# Patient Record
Sex: Male | Born: 1950
Health system: Southern US, Community
[De-identification: ages and names within clinical notes are randomized; demographics above are authoritative.]

## PROBLEM LIST (undated history)

## (undated) ENCOUNTER — Emergency Department: Admission: RE | Payer: Medicare Other | Source: Home / Self Care | Admitting: Pain Medicine

## (undated) DIAGNOSIS — M545 Low back pain, unspecified: Secondary | ICD-10-CM

## (undated) DIAGNOSIS — F4312 Post-traumatic stress disorder, chronic: Secondary | ICD-10-CM

## (undated) DIAGNOSIS — I739 Peripheral vascular disease, unspecified: Secondary | ICD-10-CM

## (undated) DIAGNOSIS — M1990 Unspecified osteoarthritis, unspecified site: Secondary | ICD-10-CM

## (undated) DIAGNOSIS — F319 Bipolar disorder, unspecified: Secondary | ICD-10-CM

## (undated) DIAGNOSIS — F419 Anxiety disorder, unspecified: Secondary | ICD-10-CM

## (undated) DIAGNOSIS — F4310 Post-traumatic stress disorder, unspecified: Secondary | ICD-10-CM

## (undated) DIAGNOSIS — F515 Nightmare disorder: Secondary | ICD-10-CM

## (undated) DIAGNOSIS — F329 Major depressive disorder, single episode, unspecified: Secondary | ICD-10-CM

## (undated) DIAGNOSIS — K4090 Unilateral inguinal hernia, without obstruction or gangrene, not specified as recurrent: Secondary | ICD-10-CM

## (undated) DIAGNOSIS — F32A Depression, unspecified: Secondary | ICD-10-CM

## (undated) HISTORY — PX: BACK SURGERY: SHX140

## (undated) HISTORY — DX: Unspecified osteoarthritis, unspecified site: M19.90

## (undated) HISTORY — DX: Nightmare disorder: F51.5

## (undated) HISTORY — DX: Anxiety disorder, unspecified: F41.9

## (undated) HISTORY — DX: Post-traumatic stress disorder, unspecified: F43.10

## (undated) HISTORY — DX: Low back pain, unspecified: M54.50

## (undated) HISTORY — DX: Major depressive disorder, single episode, unspecified: F32.9

## (undated) HISTORY — PX: CERVICAL SPINE SURGERY: SHX589

## (undated) HISTORY — DX: Depression, unspecified: F32.A

## (undated) HISTORY — DX: Peripheral vascular disease, unspecified: I73.9

## (undated) HISTORY — DX: Post-traumatic stress disorder, chronic: F43.12

## (undated) HISTORY — DX: Bipolar disorder, unspecified: F31.9

## (undated) HISTORY — DX: Unilateral inguinal hernia, without obstruction or gangrene, not specified as recurrent: K40.90

## (undated) HISTORY — DX: Low back pain: M54.5

## (undated) HISTORY — PX: NOSE SURGERY: SHX723

---

## 2013-10-12 ENCOUNTER — Emergency Department: Payer: Self-pay | Admitting: Emergency Medicine

## 2014-07-30 ENCOUNTER — Emergency Department: Payer: Medicare PPO

## 2014-07-30 ENCOUNTER — Emergency Department
Admission: EM | Admit: 2014-07-30 | Discharge: 2014-07-30 | Disposition: A | Discharge status: Other Acute Inpatient Hospital | Payer: Medicare PPO | Attending: Emergency Medicine | Admitting: Emergency Medicine

## 2014-07-30 ENCOUNTER — Encounter: Payer: Self-pay | Admitting: Emergency Medicine

## 2014-07-30 DIAGNOSIS — S129XXA Fracture of neck, unspecified, initial encounter: Secondary | ICD-10-CM | POA: Insufficient documentation

## 2014-07-30 DIAGNOSIS — S069X1A Unspecified intracranial injury with loss of consciousness of 30 minutes or less, initial encounter: Secondary | ICD-10-CM | POA: Insufficient documentation

## 2014-07-30 DIAGNOSIS — Y998 Other external cause status: Secondary | ICD-10-CM | POA: Diagnosis not present

## 2014-07-30 DIAGNOSIS — Z720 Tobacco use: Secondary | ICD-10-CM | POA: Diagnosis not present

## 2014-07-30 DIAGNOSIS — S5292XA Unspecified fracture of left forearm, initial encounter for closed fracture: Secondary | ICD-10-CM

## 2014-07-30 DIAGNOSIS — S52592A Other fractures of lower end of left radius, initial encounter for closed fracture: Secondary | ICD-10-CM | POA: Diagnosis not present

## 2014-07-30 DIAGNOSIS — S22059A Unspecified fracture of T5-T6 vertebra, initial encounter for closed fracture: Secondary | ICD-10-CM | POA: Insufficient documentation

## 2014-07-30 DIAGNOSIS — Y9289 Other specified places as the place of occurrence of the external cause: Secondary | ICD-10-CM | POA: Insufficient documentation

## 2014-07-30 DIAGNOSIS — Y9389 Activity, other specified: Secondary | ICD-10-CM | POA: Diagnosis not present

## 2014-07-30 DIAGNOSIS — S199XXA Unspecified injury of neck, initial encounter: Secondary | ICD-10-CM | POA: Diagnosis present

## 2014-07-30 DIAGNOSIS — M542 Cervicalgia: Secondary | ICD-10-CM

## 2014-07-30 MED ORDER — MORPHINE SULFATE 4 MG/ML IJ SOLN
4.0000 mg | Freq: Once | INTRAMUSCULAR | Status: AC
  Administered 2014-07-30: 4 mg via INTRAVENOUS

## 2014-07-30 MED ORDER — ONDANSETRON HCL 4 MG/2ML IJ SOLN
INTRAMUSCULAR | Status: AC
  Administered 2014-07-30: 4 mg via INTRAVENOUS
  Filled 2014-07-30: qty 2

## 2014-07-30 MED ORDER — ONDANSETRON HCL 4 MG/2ML IJ SOLN
4.0000 mg | Freq: Once | INTRAMUSCULAR | Status: AC
  Administered 2014-07-30: 4 mg via INTRAVENOUS

## 2014-07-30 MED ORDER — MORPHINE SULFATE 4 MG/ML IJ SOLN
INTRAMUSCULAR | Status: AC
  Administered 2014-07-30: 4 mg via INTRAVENOUS
  Filled 2014-07-30: qty 1

## 2014-07-30 MED ORDER — MORPHINE SULFATE 4 MG/ML IJ SOLN
INTRAMUSCULAR | Status: AC
  Filled 2014-07-30: qty 1
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
## 2014-07-30 NOTE — ED Notes (Signed)
c- collar placed on pt, charge nurse and MD aware of pt's ct result, pt moved to subwait area until open bed available
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
## 2014-07-31 DIAGNOSIS — S22019A Unspecified fracture of first thoracic vertebra, initial encounter for closed fracture: Secondary | ICD-10-CM | POA: Insufficient documentation

## 2014-07-31 DIAGNOSIS — S22030A Wedge compression fracture of third thoracic vertebra, initial encounter for closed fracture: Secondary | ICD-10-CM | POA: Insufficient documentation

## 2014-08-04 ENCOUNTER — Ambulatory Visit: Payer: Medicare PPO | Admitting: Psychiatry

## 2014-08-15 ENCOUNTER — Encounter: Payer: Self-pay | Admitting: Emergency Medicine

## 2014-08-15 ENCOUNTER — Emergency Department: Payer: Medicare PPO

## 2014-08-15 ENCOUNTER — Emergency Department
Admission: EM | Admit: 2014-08-15 | Discharge: 2014-08-15 | Disposition: A | Discharge status: Home / Self Care | Payer: Medicare PPO | Attending: Emergency Medicine | Admitting: Emergency Medicine

## 2014-08-15 DIAGNOSIS — Z9889 Other specified postprocedural states: Secondary | ICD-10-CM | POA: Diagnosis not present

## 2014-08-15 DIAGNOSIS — M25512 Pain in left shoulder: Secondary | ICD-10-CM | POA: Diagnosis present

## 2014-08-15 DIAGNOSIS — Z720 Tobacco use: Secondary | ICD-10-CM | POA: Insufficient documentation

## 2014-08-15 MED ORDER — PREDNISONE 10 MG PO TABS: ORAL_TABLET | ORAL | Status: DC

## 2014-08-15 NOTE — ED Notes (Signed)
Patient presents to the ED with painful left shoulder.  Patient had emergency neck and shoulder surgery at [facility] on June 3rd.  Patient states yesterday left shoulder started hurting unusually severely.  Patient denies any reinjury.

## 2014-08-15 NOTE — Discharge Instructions (Signed)
° ° °  CONTINUE TO WEAR SHOULDER IMMOBILIZER FOR SUPPORT UNTIL YOU SEE YOUR DOCTOR AT [facility] TOMORROW. CONTINUE YOUR CURRENT MEDICATIONS AND BEGIN PREDNISONE 3 TABLETS TODAY AND 3 TABLETS THE NEXT 2 DAYS KEEP YOUR APPOINTMENT
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
## 2014-08-15 NOTE — ED Notes (Signed)
Had recent neck surgery at [facility] for fx, yesterday with severe left shoulder pain and neck, no injury , pt recalls having his neck brace to re adjust when pain started

## 2014-08-15 NOTE — ED Notes (Signed)
Patient returned from X-ray 

## 2014-08-31 ENCOUNTER — Ambulatory Visit: Payer: Self-pay | Admitting: Family Medicine

## 2014-09-03 ENCOUNTER — Ambulatory Visit: Payer: Self-pay | Admitting: Family Medicine

## 2015-01-06 ENCOUNTER — Emergency Department: Payer: Medicare PPO

## 2015-01-06 ENCOUNTER — Emergency Department
Admission: EM | Admit: 2015-01-06 | Discharge: 2015-01-06 | Disposition: A | Discharge status: Home / Self Care | Payer: Medicare PPO | Attending: Student | Admitting: Student

## 2015-01-06 DIAGNOSIS — S52502A Unspecified fracture of the lower end of left radius, initial encounter for closed fracture: Secondary | ICD-10-CM

## 2015-01-06 DIAGNOSIS — Y9389 Activity, other specified: Secondary | ICD-10-CM | POA: Insufficient documentation

## 2015-01-06 DIAGNOSIS — Y9289 Other specified places as the place of occurrence of the external cause: Secondary | ICD-10-CM | POA: Insufficient documentation

## 2015-01-06 DIAGNOSIS — S134XXA Sprain of ligaments of cervical spine, initial encounter: Secondary | ICD-10-CM | POA: Insufficient documentation

## 2015-01-06 DIAGNOSIS — Y998 Other external cause status: Secondary | ICD-10-CM | POA: Insufficient documentation

## 2015-01-06 DIAGNOSIS — S6992XA Unspecified injury of left wrist, hand and finger(s), initial encounter: Secondary | ICD-10-CM | POA: Diagnosis present

## 2015-01-06 DIAGNOSIS — W1839XA Other fall on same level, initial encounter: Secondary | ICD-10-CM | POA: Diagnosis not present

## 2015-01-06 DIAGNOSIS — S20229A Contusion of unspecified back wall of thorax, initial encounter: Secondary | ICD-10-CM | POA: Diagnosis not present

## 2015-01-06 DIAGNOSIS — S52592A Other fractures of lower end of left radius, initial encounter for closed fracture: Secondary | ICD-10-CM | POA: Insufficient documentation

## 2015-01-06 DIAGNOSIS — Z720 Tobacco use: Secondary | ICD-10-CM | POA: Diagnosis not present

## 2015-01-06 DIAGNOSIS — Z7952 Long term (current) use of systemic steroids: Secondary | ICD-10-CM | POA: Insufficient documentation

## 2015-01-06 MED ORDER — HYDROCODONE-ACETAMINOPHEN 5-325 MG PO TABS: 1.0000 | ORAL_TABLET | ORAL | Status: DC | PRN

## 2015-01-06 MED ORDER — HYDROCODONE-ACETAMINOPHEN 5-325 MG PO TABS
1.0000 | ORAL_TABLET | Freq: Once | ORAL | Status: AC
  Administered 2015-01-06: 1 via ORAL
  Filled 2015-01-06: qty 1

## 2015-01-06 NOTE — ED Provider Notes (Signed)
[facility] Emergency Department Provider Note  ____________________________________________  Time seen: Approximately 12:22 PM  I have reviewed the triage vital signs and the nursing notes.   HISTORY  Chief Complaint Back Pain; Neck Pain; and Wrist Pain   HPI Hector George is a 64 y.o. male is here with complaint of neck pain, back pain, and wrist pain. Patient states he fell 2 weeks ago while he was putting on his pants. He states he did not fall backwards, he did not hit his head and there was no loss of consciousness. He has continued to have rest pain. He states that he fell in June of this year and had surgery which was done by the neurosurgeon. He says he does not have any medication for pain at home. Family member with him states that he did have a wrist fracture back in June but refused to see an orthopedist about it. He is uncertain whether this is a new fracture to his distal radius now or whether it is old. Family member states that he did wear a OCL splint and took it off himself. He rates his pain as an 8 out of 10. He states he is unable to rest because of the pain.   History reviewed. No pertinent past medical history.  There are no active problems to display for this patient.   Past Surgical History  Procedure Laterality Date  . Cervical spine surgery    . Back surgery      Current Outpatient Rx  Name  Route  Sig  Dispense  Refill  . HYDROcodone-acetaminophen (NORCO/VICODIN) 5-325 MG tablet   Oral   Take 1 tablet by mouth every 4 (four) hours as needed for moderate pain.   20 tablet   0   . predniSONE (DELTASONE) 10 MG tablet      Take 3 tablets once a day for 3 days   9 tablet   0     Allergies Review of patient's allergies indicates no known allergies.  No family history on file.  Social History Social History  Substance Use Topics  . Smoking status: Current Every Day Smoker    Types: Cigarettes  . Smokeless tobacco: None   . Alcohol Use: Yes    Review of Systems Constitutional: No fever/chills Eyes: No visual changes. ENT: No sore throat. Cardiovascular: Denies chest pain. Respiratory: Denies shortness of breath. Gastrointestinal: No abdominal pain.  No nausea, no vomiting.  Musculoskeletal: Positive for back pain, positive left wrist pain. Skin: Negative for rash. Neurological: Negative for headaches, focal weakness or numbness.  10-point ROS otherwise negative.  ____________________________________________   PHYSICAL EXAM:  VITAL SIGNS: ED Triage Vitals  Enc Vitals Group     BP 01/06/15 1157 116/77 mmHg     Pulse Rate 01/06/15 1157 97     Resp 01/06/15 1157 20     Temp 01/06/15 1157 97.8 F (36.6 C)     Temp Source 01/06/15 1157 Oral     SpO2 01/06/15 1157 99 %     Weight 01/06/15 1157 127 lb (57.607 kg)     Height 01/06/15 1157  (1.727 m)     Head Cir --      Peak Flow --      Pain Score 01/06/15 1157 8     Pain Loc --      Pain Edu? --      Excl. in GC? --     Constitutional: Alert and oriented. Well appearing and  in no acute distress. Eyes: Conjunctivae are normal. PERRL. EOMI. Head: Atraumatic. Nose: No congestion/rhinnorhea. Neck: No stridor.  There is some tenderness on palpation of cervical spine. There is a well-healed surgical scar. Vertebral changes posteriorly are secondary to surgery. Range of motion is minimally restricted secondary to discomfort. Cardiovascular: Normal rate, regular rhythm. Grossly normal heart sounds.  Good peripheral circulation. Respiratory: Normal respiratory effort.  No retractions. Lungs CTAB. Gastrointestinal: Soft and nontender. No distention. Musculoskeletal: Moves extremities with no difficulty. Thoracic spine is somewhat tender on palpation around T1-T2 and T3. No gross deformity was noted. No lower extremity tenderness nor edema. Left wrist there is moderate tenderness along with some soft tissue swelling present. Range of motion is  minimally restricted in flexion and extension secondary to pain. Neurologic:  Normal speech and language. No gross focal neurologic deficits are appreciated. Patient was seen multiple times walking through the emergency room at a fast rate without any difficulty at all. Skin:  Skin is warm, dry and intact. No rash noted. Psychiatric: Mood and affect are normal. Speech and behavior are normal.  ____________________________________________   LABS (all labs ordered are listed, but only abnormal results are displayed)  Labs Reviewed - No data to display  RADIOLOGY Left wrist shows acute on chronic distal radial fracture. Extensive soft tissue swelling is present. Per radiologist.  Cervical spine x-ray shows prior fracture of T1 vertebral body. There is moderate disc space narrowing at C4-C5 and C6-C7. There is postoperative screw present along with plate fixation.    Thoracic spine x-ray per radiologist shows prior fracture T1 vertebral body with fixation posteriorly. There is a mild anterior wedge T9 vertebral body.  ____________________________________________   PROCEDURES  Procedure(s) performed: None  Critical Care performed: No  ____________________________________________   INITIAL IMPRESSION / ASSESSMENT AND PLAN / ED COURSE  Pertinent labs & imaging results that were available during my care of the patient were reviewed by me and considered in my medical decision making (see chart for details).  Patient was not tender around T9 area. He was given information about his other x-rays and was placed in a cockup wrist splint for his left wrist fracture. He was encouraged to call and make an appointment with the orthopedist this time to follow-up with his wrist rather than just where the splint taken off.   he was given prescription for Norco as needed for pain. Deltasone 10 mg 3 tablets once a day for 3 days. Patient was told this would help also with inflammation from his  arthritis and prior surgery. ____________________________________________   FINAL CLINICAL IMPRESSION(S) / ED DIAGNOSES  Final diagnoses:  Fracture of distal end of radius, left, closed, initial encounter  Contusion of upper back, unspecified laterality, initial encounter  Sprain of ligaments of cervical spine, initial encounter      Tommi RumpsRhonda L Alajah Witman, PA-C 01/06/15 1522  Gayla DossEryka A Gayle, MD 01/06/15 1626

## 2015-01-06 NOTE — ED Notes (Signed)
Pt reports falling X 2 weeks ago, pain in back, neck and left wrist since. Pt hx of back surgery 07/27/04. Ambulatory. Pt alert and oriented X4, active, cooperative, pt in NAD. RR even and unlabored, color WNL.

## 2015-01-06 NOTE — Discharge Instructions (Signed)
ICE AND ELEVATE FOR SWELLING.  NORCO FOR PAIN AS NEEDED CALL AND MAKE AN APPOINTMENT AT ORTHOPEDIST (DR. MENZ) FOR FOLLOW UP WITH YOUR WRIST. SEE YOUR NEUROSURGEON IF ANY CONTINUED NECK PROBLEMS

## 2015-01-28 ENCOUNTER — Encounter: Payer: Self-pay | Admitting: Medical Oncology

## 2015-01-28 ENCOUNTER — Emergency Department
Admission: EM | Admit: 2015-01-28 | Discharge: 2015-01-28 | Disposition: A | Discharge status: Home / Self Care | Payer: Medicare PPO | Attending: Emergency Medicine | Admitting: Emergency Medicine

## 2015-01-28 DIAGNOSIS — Y92007 Garden or yard of unspecified non-institutional (private) residence as the place of occurrence of the external cause: Secondary | ICD-10-CM | POA: Diagnosis not present

## 2015-01-28 DIAGNOSIS — Y998 Other external cause status: Secondary | ICD-10-CM | POA: Insufficient documentation

## 2015-01-28 DIAGNOSIS — S199XXA Unspecified injury of neck, initial encounter: Secondary | ICD-10-CM | POA: Diagnosis not present

## 2015-01-28 DIAGNOSIS — S29011A Strain of muscle and tendon of front wall of thorax, initial encounter: Secondary | ICD-10-CM | POA: Diagnosis not present

## 2015-01-28 DIAGNOSIS — F17210 Nicotine dependence, cigarettes, uncomplicated: Secondary | ICD-10-CM | POA: Diagnosis not present

## 2015-01-28 DIAGNOSIS — S29012A Strain of muscle and tendon of back wall of thorax, initial encounter: Secondary | ICD-10-CM

## 2015-01-28 DIAGNOSIS — Z7952 Long term (current) use of systemic steroids: Secondary | ICD-10-CM | POA: Diagnosis not present

## 2015-01-28 DIAGNOSIS — Y9389 Activity, other specified: Secondary | ICD-10-CM | POA: Insufficient documentation

## 2015-01-28 DIAGNOSIS — W1839XA Other fall on same level, initial encounter: Secondary | ICD-10-CM | POA: Diagnosis not present

## 2015-01-28 DIAGNOSIS — S299XXA Unspecified injury of thorax, initial encounter: Secondary | ICD-10-CM | POA: Diagnosis present

## 2015-01-28 MED ORDER — MELOXICAM 15 MG PO TABS: 15.0000 mg | ORAL_TABLET | Freq: Every day | ORAL | Status: DC

## 2015-01-28 MED ORDER — HYDROCODONE-ACETAMINOPHEN 5-325 MG PO TABS: 1.0000 | ORAL_TABLET | ORAL | Status: DC | PRN

## 2015-01-28 NOTE — ED Notes (Signed)
Chronic neck back pain

## 2015-01-28 NOTE — ED Notes (Signed)
Pt reports that he has had back surgery in June, states that he began having worsening back pain and pain to left wrist over 2 days. Denies new injury.
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
## 2015-01-28 NOTE — Discharge Instructions (Signed)

## 2015-02-09 ENCOUNTER — Emergency Department
Admission: EM | Admit: 2015-02-09 | Discharge: 2015-02-09 | Disposition: A | Discharge status: Home / Self Care | Payer: Medicare PPO | Attending: Student | Admitting: Student

## 2015-02-09 ENCOUNTER — Encounter: Payer: Self-pay | Admitting: Emergency Medicine

## 2015-02-09 DIAGNOSIS — G8929 Other chronic pain: Secondary | ICD-10-CM | POA: Insufficient documentation

## 2015-02-09 DIAGNOSIS — F17210 Nicotine dependence, cigarettes, uncomplicated: Secondary | ICD-10-CM | POA: Diagnosis not present

## 2015-02-09 DIAGNOSIS — M546 Pain in thoracic spine: Secondary | ICD-10-CM | POA: Insufficient documentation

## 2015-02-09 DIAGNOSIS — Z791 Long term (current) use of non-steroidal anti-inflammatories (NSAID): Secondary | ICD-10-CM | POA: Insufficient documentation

## 2015-02-09 DIAGNOSIS — M549 Dorsalgia, unspecified: Secondary | ICD-10-CM

## 2015-02-09 MED ORDER — HYDROCODONE-ACETAMINOPHEN 5-325 MG PO TABS: 1.0000 | ORAL_TABLET | Freq: Four times a day (QID) | ORAL | Status: DC | PRN
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
## 2015-02-09 NOTE — Discharge Instructions (Signed)
[facility] Norco as needed for pain only as directed.    Discontinue taking tramadol.
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
## 2015-03-08 ENCOUNTER — Encounter (HOSPITAL_COMMUNITY): Payer: Self-pay | Admitting: Emergency Medicine

## 2015-03-08 ENCOUNTER — Emergency Department (HOSPITAL_COMMUNITY): Payer: Commercial Managed Care - HMO

## 2015-03-08 ENCOUNTER — Emergency Department (HOSPITAL_COMMUNITY)
Admission: EM | Admit: 2015-03-08 | Discharge: 2015-03-08 | Disposition: A | Discharge status: Home / Self Care | Payer: Commercial Managed Care - HMO | Attending: Emergency Medicine | Admitting: Emergency Medicine

## 2015-03-08 DIAGNOSIS — Z79899 Other long term (current) drug therapy: Secondary | ICD-10-CM | POA: Insufficient documentation

## 2015-03-08 DIAGNOSIS — R42 Dizziness and giddiness: Secondary | ICD-10-CM | POA: Diagnosis not present

## 2015-03-08 DIAGNOSIS — F17210 Nicotine dependence, cigarettes, uncomplicated: Secondary | ICD-10-CM | POA: Insufficient documentation

## 2015-03-08 DIAGNOSIS — Z791 Long term (current) use of non-steroidal anti-inflammatories (NSAID): Secondary | ICD-10-CM | POA: Insufficient documentation

## 2015-03-08 DIAGNOSIS — M542 Cervicalgia: Secondary | ICD-10-CM

## 2015-03-08 DIAGNOSIS — Z7982 Long term (current) use of aspirin: Secondary | ICD-10-CM | POA: Insufficient documentation

## 2015-03-08 DIAGNOSIS — M546 Pain in thoracic spine: Secondary | ICD-10-CM | POA: Diagnosis not present

## 2015-03-08 MED ORDER — HYDROCODONE-ACETAMINOPHEN 5-325 MG PO TABS
1.0000 | ORAL_TABLET | Freq: Once | ORAL | Status: AC
  Administered 2015-03-08: 1 via ORAL
  Filled 2015-03-08: qty 1

## 2015-03-08 MED ORDER — HYDROCODONE-ACETAMINOPHEN 5-325 MG PO TABS: ORAL_TABLET | ORAL | Status: DC

## 2015-03-08 MED ORDER — METHOCARBAMOL 500 MG PO TABS: 500.0000 mg | ORAL_TABLET | Freq: Three times a day (TID) | ORAL | Status: DC

## 2015-03-08 NOTE — ED Notes (Signed)
Pt given OJ to drink.  

## 2015-03-08 NOTE — ED Notes (Signed)
Pt states that he had neck surgery in June with hardware placement and is having pain with no new injury.
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
## 2015-03-10 DIAGNOSIS — Z981 Arthrodesis status: Secondary | ICD-10-CM | POA: Diagnosis not present

## 2015-03-10 DIAGNOSIS — M47812 Spondylosis without myelopathy or radiculopathy, cervical region: Secondary | ICD-10-CM | POA: Diagnosis not present

## 2015-03-10 DIAGNOSIS — M40202 Unspecified kyphosis, cervical region: Secondary | ICD-10-CM | POA: Diagnosis not present

## 2015-03-23 DIAGNOSIS — I10 Essential (primary) hypertension: Secondary | ICD-10-CM | POA: Diagnosis not present

## 2015-03-23 DIAGNOSIS — N400 Enlarged prostate without lower urinary tract symptoms: Secondary | ICD-10-CM | POA: Diagnosis not present

## 2015-03-23 DIAGNOSIS — F17200 Nicotine dependence, unspecified, uncomplicated: Secondary | ICD-10-CM | POA: Diagnosis not present

## 2015-03-23 DIAGNOSIS — F329 Major depressive disorder, single episode, unspecified: Secondary | ICD-10-CM | POA: Diagnosis not present

## 2015-03-23 DIAGNOSIS — Z681 Body mass index (BMI) 19 or less, adult: Secondary | ICD-10-CM | POA: Diagnosis not present

## 2015-04-12 DIAGNOSIS — M542 Cervicalgia: Secondary | ICD-10-CM | POA: Diagnosis not present

## 2015-04-12 DIAGNOSIS — S22010D Wedge compression fracture of first thoracic vertebra, subsequent encounter for fracture with routine healing: Secondary | ICD-10-CM | POA: Diagnosis not present

## 2015-05-11 ENCOUNTER — Ambulatory Visit: Payer: Commercial Managed Care - HMO | Admitting: Anesthesiology

## 2015-08-17 DIAGNOSIS — M5440 Lumbago with sciatica, unspecified side: Secondary | ICD-10-CM | POA: Diagnosis not present

## 2015-08-17 DIAGNOSIS — I739 Peripheral vascular disease, unspecified: Secondary | ICD-10-CM | POA: Diagnosis not present

## 2015-08-17 DIAGNOSIS — J449 Chronic obstructive pulmonary disease, unspecified: Secondary | ICD-10-CM | POA: Diagnosis not present

## 2015-12-27 ENCOUNTER — Emergency Department
Admission: EM | Admit: 2015-12-27 | Discharge: 2015-12-27 | Disposition: A | Discharge status: Home / Self Care | Payer: Commercial Managed Care - HMO | Attending: Emergency Medicine | Admitting: Emergency Medicine

## 2015-12-27 ENCOUNTER — Emergency Department: Payer: Commercial Managed Care - HMO

## 2015-12-27 ENCOUNTER — Encounter: Payer: Self-pay | Admitting: Emergency Medicine

## 2015-12-27 DIAGNOSIS — Z7982 Long term (current) use of aspirin: Secondary | ICD-10-CM | POA: Insufficient documentation

## 2015-12-27 DIAGNOSIS — L723 Sebaceous cyst: Secondary | ICD-10-CM | POA: Insufficient documentation

## 2015-12-27 DIAGNOSIS — R05 Cough: Secondary | ICD-10-CM | POA: Diagnosis present

## 2015-12-27 DIAGNOSIS — Z79899 Other long term (current) drug therapy: Secondary | ICD-10-CM | POA: Diagnosis not present

## 2015-12-27 DIAGNOSIS — F419 Anxiety disorder, unspecified: Secondary | ICD-10-CM | POA: Diagnosis not present

## 2015-12-27 DIAGNOSIS — R079 Chest pain, unspecified: Secondary | ICD-10-CM | POA: Diagnosis not present

## 2015-12-27 DIAGNOSIS — J40 Bronchitis, not specified as acute or chronic: Secondary | ICD-10-CM | POA: Insufficient documentation

## 2015-12-27 DIAGNOSIS — F17210 Nicotine dependence, cigarettes, uncomplicated: Secondary | ICD-10-CM | POA: Insufficient documentation

## 2015-12-27 MED ORDER — SULFAMETHOXAZOLE-TRIMETHOPRIM 800-160 MG PO TABS: 1.0000 | ORAL_TABLET | Freq: Two times a day (BID) | ORAL | 0 refills | Status: DC

## 2015-12-27 MED ORDER — PSEUDOEPH-BROMPHEN-DM 30-2-10 MG/5ML PO SYRP: 5.0000 mL | ORAL_SOLUTION | Freq: Four times a day (QID) | ORAL | 0 refills | Status: DC | PRN

## 2015-12-27 NOTE — ED Triage Notes (Signed)
Presents with cough since sat  No fever  State cough is occasionally prod

## 2015-12-27 NOTE — ED Provider Notes (Signed)
[facility] Emergency Department Provider Note   ____________________________________________   First MD Initiated Contact with Patient 12/27/15 1130     (approximate)  I have reviewed the triage vital signs and the nursing notes.   HISTORY  Chief Complaint Cough and Abscess    HPI Hector George is a 65 y.o. male patient complaining of 3 days of productive cough. Patient denies any fever with this cough. Patient states this intermittent sinus congestion. Patient denies any nausea vomiting or diarrhea. Patient also concern about 2 nodular lesion posterior inferior neck. Patient states the lesions have been apparent for over a year but noticed increase in size in the past week. Patient had cervical fusion from C5-T2. Patient denies any pain associated with these lesions.No palliative measures for both complaints.   History reviewed. No pertinent past medical history.  There are no active problems to display for this patient.   Past Surgical History:  Procedure Laterality Date  . BACK SURGERY    . CERVICAL SPINE SURGERY      Prior to Admission medications   Medication Sig Start Date End Date Taking? Authorizing Provider  ARIPiprazole (ABILIFY) 20 MG tablet Take 10 mg by mouth daily.    Historical Provider, MD  aspirin 325 MG tablet Take 325 mg by mouth 3 (three) times daily as needed (pain).    Historical Provider, MD  Aspirin-Salicylamide-Caffeine (BC HEADACHE POWDER PO) Take 1 packet by mouth daily as needed (pain).    Historical Provider, MD  brompheniramine-pseudoephedrine-DM 30-2-10 MG/5ML syrup Take 5 mLs by mouth 4 (four) times daily as needed. 12/27/15   Joni Reiningonald K Smith, PA-C  FLUoxetine (PROZAC) 10 MG capsule Take 30 mg by mouth daily.    Historical Provider, MD  HYDROcodone-acetaminophen (NORCO/VICODIN) 5-325 MG tablet Take one-two tabs po q 4-6 hrs prn pain 03/08/15   Tammy Triplett, PA-C  meloxicam (MOBIC) 15 MG tablet Take 1 tablet (15 mg  total) by mouth daily. 01/28/15   Delorise RoyalsJonathan D Cuthriell, PA-C  methocarbamol (ROBAXIN) 500 MG tablet Take 1 tablet (500 mg total) by mouth 3 (three) times daily. 03/08/15   Tammy Triplett, PA-C  predniSONE (DELTASONE) 10 MG tablet Take 3 tablets once a day for 3 days 08/15/14   Tommi Rumpshonda L Summers, PA-C  sulfamethoxazole-trimethoprim (BACTRIM DS,SEPTRA DS) 800-160 MG tablet Take 1 tablet by mouth 2 (two) times daily. 12/27/15   Joni Reiningonald K Smith, PA-C    Allergies Review of patient's allergies indicates no known allergies.  No family history on file.  Social History Social History  Substance Use Topics  . Smoking status: Current Every Day Smoker    Types: Cigarettes  . Smokeless tobacco: Never Used  . Alcohol use No    Review of Systems Constitutional: No fever/chills Eyes: No visual changes. ENT: No sore throat. Cardiovascular: Denies chest pain. Respiratory: Denies shortness of breath. Productive cough and chest congestion. Gastrointestinal: No abdominal pain.  No nausea, no vomiting.  No diarrhea.  No constipation. Genitourinary: Negative for dysuria. Musculoskeletal: Negative for back pain. Skin: Negative for rash. 2 nodular lesions posterior neck Neurological: Negative for headaches, focal weakness or numbness. Psychiatric:Anxiety ____________________________________________   PHYSICAL EXAM:  VITAL SIGNS: ED Triage Vitals  Enc Vitals Group     BP 12/27/15 1001 130/77     Pulse Rate 12/27/15 1001 71     Resp 12/27/15 1001 20     Temp 12/27/15 1001 (!) 93.2 F (34 C)     Temp Source 12/27/15 1001 Oral  SpO2 12/27/15 1001 97 %     Weight 12/27/15 1002 130 lb (59 kg)     Height 12/27/15 1002 5\' 8"  (1.727 m)     Head Circumference --      Peak Flow --      Pain Score --      Pain Loc --      Pain Edu? --      Excl. in GC? --     Constitutional: Alert and oriented. Well appearing and in no acute distress. Eyes: Conjunctivae are normal. PERRL. EOMI. Head:  Atraumatic. Nose: No congestion/rhinnorhea. Mouth/Throat: Mucous membranes are moist.  Oropharynx non-erythematous. Neck: No stridor.  No cervical spine tenderness to palpation. Hematological/Lymphatic/Immunilogical: No cervical lymphadenopathy. Cardiovascular: Normal rate, regular rhythm. Grossly normal heart sounds.  Good peripheral circulation. Respiratory: Normal respiratory effort.  No retractions. Lungs with bilateral Rales Gastrointestinal: Soft and nontender. No distention. No abdominal bruits. No CVA tenderness. Musculoskeletal: No lower extremity tenderness nor edema.  No joint effusions. Neurologic:  Normal speech and language. No gross focal neurologic deficits are appreciated. No gait instability. Skin:  Skin is warm, dry and intact. 2 cystic lesion posterior neck. Inferior lesion is mildly erythematous. No drainage and nonfluctuant this time. Psychiatric: Mood and affect are normal. Speech and behavior are normal.  ____________________________________________   LABS (all labs ordered are listed, but only abnormal results are displayed)  Labs Reviewed - No data to display ____________________________________________  EKG   ____________________________________________  RADIOLOGY   ____________________________________________   PROCEDURES  Procedure(s) performed: None  Procedures  Critical Care performed: No  ____________________________________________   INITIAL IMPRESSION / ASSESSMENT AND PLAN / ED COURSE  Pertinent labs & imaging results that were available during my care of the patient were reviewed by me and considered in my medical decision making (see chart for details).  Bronchitis. Sebaceous cysts posterior neck. Patient given discharge care instructions. Patient get a prescription for Bactrim DS) felt DM. [facility] nodular lesions.  Clinical Course   Discussed rationale for not incising  and draining the areas today. [facility].  ____________________________________________   FINAL CLINICAL IMPRESSION(S) / ED DIAGNOSES  Final diagnoses:  Bronchitis  Sebaceous cyst      NEW MEDICATIONS STARTED DURING THIS VISIT:  New Prescriptions   BROMPHENIRAMINE-PSEUDOEPHEDRINE-DM 30-2-10 MG/5ML SYRUP    Take 5 mLs by mouth 4 (four) times daily as needed.   SULFAMETHOXAZOLE-TRIMETHOPRIM (BACTRIM DS,SEPTRA DS) 800-160 MG TABLET    Take 1 tablet by mouth 2 (two) times daily.     Note:  This document was prepared using Dragon voice recognition software and may include unintentional dictation errors.    Joni Reiningonald K Smith, PA-C 12/27/15 1146    Jeanmarie PlantJames A McShane, MD 12/29/15 [phone]

## 2016-06-05 DIAGNOSIS — M545 Low back pain: Secondary | ICD-10-CM | POA: Diagnosis not present

## 2016-06-05 DIAGNOSIS — M4150 Other secondary scoliosis, site unspecified: Secondary | ICD-10-CM | POA: Diagnosis not present

## 2016-06-05 DIAGNOSIS — M4135 Thoracogenic scoliosis, thoracolumbar region: Secondary | ICD-10-CM | POA: Diagnosis not present

## 2016-06-05 DIAGNOSIS — M4316 Spondylolisthesis, lumbar region: Secondary | ICD-10-CM | POA: Diagnosis not present

## 2016-06-05 DIAGNOSIS — G8929 Other chronic pain: Secondary | ICD-10-CM | POA: Diagnosis not present

## 2016-06-05 DIAGNOSIS — M419 Scoliosis, unspecified: Secondary | ICD-10-CM | POA: Diagnosis not present

## 2016-10-24 DIAGNOSIS — J439 Emphysema, unspecified: Secondary | ICD-10-CM | POA: Diagnosis not present

## 2016-10-24 DIAGNOSIS — F419 Anxiety disorder, unspecified: Secondary | ICD-10-CM | POA: Diagnosis not present

## 2016-10-24 DIAGNOSIS — F329 Major depressive disorder, single episode, unspecified: Secondary | ICD-10-CM | POA: Diagnosis not present

## 2016-10-24 DIAGNOSIS — M5440 Lumbago with sciatica, unspecified side: Secondary | ICD-10-CM | POA: Diagnosis not present

## 2017-03-08 DIAGNOSIS — F419 Anxiety disorder, unspecified: Secondary | ICD-10-CM | POA: Diagnosis not present

## 2017-03-08 DIAGNOSIS — H81319 Aural vertigo, unspecified ear: Secondary | ICD-10-CM | POA: Diagnosis not present

## 2017-03-08 DIAGNOSIS — I739 Peripheral vascular disease, unspecified: Secondary | ICD-10-CM | POA: Diagnosis not present

## 2017-03-08 DIAGNOSIS — R42 Dizziness and giddiness: Secondary | ICD-10-CM | POA: Diagnosis not present

## 2017-03-08 DIAGNOSIS — M5440 Lumbago with sciatica, unspecified side: Secondary | ICD-10-CM | POA: Diagnosis not present

## 2017-04-18 DIAGNOSIS — M1990 Unspecified osteoarthritis, unspecified site: Secondary | ICD-10-CM | POA: Diagnosis not present

## 2017-04-18 DIAGNOSIS — F329 Major depressive disorder, single episode, unspecified: Secondary | ICD-10-CM | POA: Diagnosis not present

## 2017-04-18 DIAGNOSIS — R5381 Other malaise: Secondary | ICD-10-CM | POA: Diagnosis not present

## 2017-04-18 DIAGNOSIS — M5440 Lumbago with sciatica, unspecified side: Secondary | ICD-10-CM | POA: Diagnosis not present

## 2017-04-18 DIAGNOSIS — I10 Essential (primary) hypertension: Secondary | ICD-10-CM | POA: Diagnosis not present

## 2017-04-18 DIAGNOSIS — E7849 Other hyperlipidemia: Secondary | ICD-10-CM | POA: Diagnosis not present

## 2017-04-18 DIAGNOSIS — Z125 Encounter for screening for malignant neoplasm of prostate: Secondary | ICD-10-CM | POA: Diagnosis not present

## 2017-04-18 DIAGNOSIS — F419 Anxiety disorder, unspecified: Secondary | ICD-10-CM | POA: Diagnosis not present

## 2017-04-18 DIAGNOSIS — E034 Atrophy of thyroid (acquired): Secondary | ICD-10-CM | POA: Diagnosis not present

## 2017-06-17 DIAGNOSIS — M5440 Lumbago with sciatica, unspecified side: Secondary | ICD-10-CM | POA: Diagnosis not present

## 2017-06-17 DIAGNOSIS — F329 Major depressive disorder, single episode, unspecified: Secondary | ICD-10-CM | POA: Diagnosis not present

## 2017-06-17 DIAGNOSIS — K469 Unspecified abdominal hernia without obstruction or gangrene: Secondary | ICD-10-CM | POA: Diagnosis not present

## 2017-06-17 DIAGNOSIS — F419 Anxiety disorder, unspecified: Secondary | ICD-10-CM | POA: Diagnosis not present

## 2017-09-07 ENCOUNTER — Encounter: Payer: Self-pay | Admitting: Emergency Medicine

## 2017-09-07 ENCOUNTER — Emergency Department
Admission: EM | Admit: 2017-09-07 | Discharge: 2017-09-07 | Disposition: A | Discharge status: Home / Self Care | Payer: Medicare HMO | Attending: Emergency Medicine | Admitting: Emergency Medicine

## 2017-09-07 ENCOUNTER — Other Ambulatory Visit: Payer: Self-pay

## 2017-09-07 DIAGNOSIS — S0105XA Open bite of scalp, initial encounter: Secondary | ICD-10-CM | POA: Diagnosis not present

## 2017-09-07 DIAGNOSIS — Z2914 Encounter for prophylactic rabies immune globin: Secondary | ICD-10-CM | POA: Insufficient documentation

## 2017-09-07 DIAGNOSIS — Z7982 Long term (current) use of aspirin: Secondary | ICD-10-CM | POA: Diagnosis not present

## 2017-09-07 DIAGNOSIS — Z23 Encounter for immunization: Secondary | ICD-10-CM

## 2017-09-07 DIAGNOSIS — S01352A Open bite of left ear, initial encounter: Secondary | ICD-10-CM

## 2017-09-07 DIAGNOSIS — Y999 Unspecified external cause status: Secondary | ICD-10-CM | POA: Insufficient documentation

## 2017-09-07 DIAGNOSIS — W540XXA Bitten by dog, initial encounter: Secondary | ICD-10-CM | POA: Insufficient documentation

## 2017-09-07 DIAGNOSIS — Z79899 Other long term (current) drug therapy: Secondary | ICD-10-CM | POA: Insufficient documentation

## 2017-09-07 DIAGNOSIS — Y929 Unspecified place or not applicable: Secondary | ICD-10-CM | POA: Insufficient documentation

## 2017-09-07 DIAGNOSIS — F17210 Nicotine dependence, cigarettes, uncomplicated: Secondary | ICD-10-CM | POA: Insufficient documentation

## 2017-09-07 DIAGNOSIS — Y9389 Activity, other specified: Secondary | ICD-10-CM | POA: Diagnosis not present

## 2017-09-07 MED ORDER — RABIES VACCINE, PCEC IM SUSR
1.0000 mL | Freq: Once | INTRAMUSCULAR | Status: AC
  Administered 2017-09-07: 1 mL via INTRAMUSCULAR
  Filled 2017-09-07: qty 1

## 2017-09-07 MED ORDER — OXYCODONE-ACETAMINOPHEN 5-325 MG PO TABS: 1.0000 | ORAL_TABLET | Freq: Four times a day (QID) | ORAL | 0 refills | Status: DC | PRN

## 2017-09-07 MED ORDER — RABIES IMMUNE GLOBULIN 150 UNIT/ML IM INJ
20.0000 [IU]/kg | INJECTION | Freq: Once | INTRAMUSCULAR | Status: AC
  Administered 2017-09-07: 1125 [IU] via INTRAMUSCULAR
  Filled 2017-09-07: qty 8

## 2017-09-07 MED ORDER — LIDOCAINE HCL (PF) 1 % IJ SOLN
10.0000 mL | Freq: Once | INTRAMUSCULAR | Status: AC
  Administered 2017-09-07: 10 mL
  Filled 2017-09-07: qty 10

## 2017-09-07 MED ORDER — AMOXICILLIN-POT CLAVULANATE 875-125 MG PO TABS: 1.0000 | ORAL_TABLET | Freq: Two times a day (BID) | ORAL | 0 refills | Status: AC

## 2017-09-07 MED ORDER — IBUPROFEN 600 MG PO TABS
600.0000 mg | ORAL_TABLET | Freq: Once | ORAL | Status: AC
Start: 2017-09-07 — End: 2017-09-07
  Administered 2017-09-07: 600 mg via ORAL
  Filled 2017-09-07: qty 1

## 2017-09-07 NOTE — Discharge Instructions (Addendum)
Return to the emergency department for continued rabies injections.  July 16 will be your next injection.  You will also need to return on July 20 and 27. Watch area for any signs of infection.  Begin taking your antibiotic twice a day for the next 7 days.  Percocet is for pain as needed.  Do not drive or operate machinery while taking this medication.  You may clean the area with mild soap and water daily.  Also in 10 days sutures can be removed.  You can go to your primary care provider for suture removal or return to the emergency department.

## 2017-09-07 NOTE — ED Provider Notes (Signed)
[facility] Emergency Department Provider Note   ____________________________________________   First MD Initiated Contact with Patient 09/07/17 0935     (approximate)  I have reviewed the triage vital signs and the nursing notes.   HISTORY  Chief Complaint Animal Bite   HPI Hector George is a 67 y.o. male is here with multiple lacerations from a dog bite that occurred between 8 and 9:30 PM yesterday.  Patient states that he was sitting turtle lines when he noticed a large dog in the area.  He states initially the dog was friendly.  He states he has finished there multiple times and is never seen this dog.  Patient caught a turtle and at that time the dog began attacking him.  He has lacerations to his forearms.  He has 2 large lacerations to his left ear.  Patient states that in order to get the dog off of him he had to take his knife and stabbing.  He states the dog has ran off since then.  Patient rates his pain as a 10/10.   History reviewed. No pertinent past medical history.  There are no active problems to display for this patient.   Past Surgical History:  Procedure Laterality Date  . BACK SURGERY    . CERVICAL SPINE SURGERY      Prior to Admission medications   Medication Sig Start Date End Date Taking? Authorizing Provider  amoxicillin-clavulanate (AUGMENTIN) 875-125 MG tablet Take 1 tablet by mouth 2 (two) times daily for 7 days. 09/07/17 09/14/17  Tommi RumpsSummers, Alizay Bronkema L, PA-C  ARIPiprazole (ABILIFY) 20 MG tablet Take 10 mg by mouth daily.    [provider]  aspirin 325 MG tablet Take 325 mg by mouth 3 (three) times daily as needed (pain).    [provider]  Aspirin-Salicylamide-Caffeine (BC HEADACHE POWDER PO) Take 1 packet by mouth daily as needed (pain).    [provider]  FLUoxetine (PROZAC) 10 MG capsule Take 30 mg by mouth daily.    [provider]  HYDROcodone-acetaminophen (NORCO/VICODIN) 5-325 MG  tablet Take one-two tabs po q 4-6 hrs prn pain 03/08/15   Triplett, Tammy, PA-C  meloxicam (MOBIC) 15 MG tablet Take 1 tablet (15 mg total) by mouth daily. 01/28/15   Cuthriell, Delorise RoyalsJonathan D, PA-C  oxyCODONE-acetaminophen (PERCOCET) 5-325 MG tablet Take 1 tablet by mouth every 6 (six) hours as needed for moderate pain. 09/07/17 09/07/18  Tommi RumpsSummers, Quintrell Baze L, PA-C    Allergies Patient has no known allergies.  No family history on file.  Social History Social History   Tobacco Use  . Smoking status: Current Every Day Smoker    Types: Cigarettes  . Smokeless tobacco: Never Used  Substance Use Topics  . Alcohol use: No  . Drug use: Yes    Types: Marijuana    Review of Systems Constitutional: No fever/chills Eyes: No visual changes. ENT: Laceration to left ear.  No facial lacerations. Cardiovascular: Denies chest pain. Respiratory: Denies shortness of breath. Musculoskeletal: Negative for back pain. Skin: Multiple lacerations secondary to dog attack. Neurological: Negative for headaches, focal weakness or numbness. ____________________________________________   PHYSICAL EXAM:  VITAL SIGNS: ED Triage Vitals  Enc Vitals Group     BP 09/07/17 0921 137/78     Pulse Rate 09/07/17 0921 73     Resp 09/07/17 0921 20     Temp 09/07/17 0921 98.1 F (36.7 C)     Temp Source 09/07/17 0921 Oral     SpO2 09/07/17  0921 98 %     Weight 09/07/17 0925 122 lb (55.3 kg)     Height 09/07/17 0925 5\' 8"  (1.727 m)     Head Circumference --      Peak Flow --      Pain Score 09/07/17 0925 10     Pain Loc --      Pain Edu? --      Excl. in GC? --    Constitutional: Alert and oriented. Well appearing and in no acute distress. Eyes: Conjunctivae are normal.  Head: Atraumatic. Nose: No trauma. Neck: No stridor.   Cardiovascular: Normal rate, regular rhythm. Grossly normal heart sounds.  Good peripheral circulation. Respiratory: Normal respiratory effort.  No retractions. Lungs  CTAB. Gastrointestinal: Soft and nontender. No distention.  Musculoskeletal: Moves upper and lower extremities without any difficulty.  Patient is able to ambulate without any assistance. Neurologic:  Normal speech and language. No gross focal neurologic deficits are appreciated.  Skin:  Skin is warm, dry.  There are multiple dog bites.  Patient has puncture wounds to bilateral forearms defending his face.  Patient also is noted to have a laceration to his left ear and left lateral scalp.  Bleeding is under control and no foreign bodies are noted. Psychiatric: Mood and affect are normal. Speech and behavior are normal.  ____________________________________________   LABS (all labs ordered are listed, but only abnormal results are displayed)  Labs Reviewed - No data to display   PROCEDURES  Procedure(s) performed:    Marland KitchenMarland KitchenLaceration Repair Date/Time: 09/07/2017 11:30 AM Performed by: Tommi Rumps, PA-C Authorized by: Tommi Rumps, PA-C   Consent:    Consent obtained:  Verbal   Consent given by:  Patient   Risks discussed:  Poor wound healing Anesthesia (see MAR for exact dosages):    Anesthesia method:  Local infiltration   Local anesthetic:  Lidocaine 1% w/o epi Laceration details:    Location:  Scalp   Scalp location:  L parietal   Length (cm):  14 Repair type:    Repair type:  Simple Pre-procedure details:    Preparation:  Patient was prepped and draped in usual sterile fashion Exploration:    Contaminated: no   Treatment:    Area cleansed with:  Saline and Betadine   Amount of cleaning:  Extensive   Irrigation solution:  Sterile saline   Irrigation method:  Pressure wash and syringe   Visualized foreign bodies/material removed: no   Skin repair:    Repair method:  Sutures   Suture size:  6-0   Suture material:  Prolene   Suture technique:  Simple interrupted   Number of sutures:  14 Approximation:    Approximation:  Loose Post-procedure details:     Dressing:  Open (no dressing) .Marland KitchenLaceration Repair Date/Time: 09/07/2017 11:41 AM Performed by: Tommi Rumps, PA-C Authorized by: Tommi Rumps, PA-C   Consent:    Consent obtained:  Verbal   Consent given by:  Patient   Risks discussed:  Poor wound healing Anesthesia (see MAR for exact dosages):    Anesthesia method:  Local infiltration   Local anesthetic:  Lidocaine 1% w/o epi Laceration details:    Location:  Ear   Ear location:  L ear   Length (cm):  3 Repair type:    Repair type:  Simple Pre-procedure details:    Preparation:  Patient was prepped and draped in usual sterile fashion Exploration:    Contaminated: no   Treatment:  Area cleansed with:  Betadine and saline   Amount of cleaning:  Extensive   Irrigation solution:  Sterile saline   Irrigation method:  Syringe   Visualized foreign bodies/material removed: no   Skin repair:    Repair method:  Sutures   Suture size:  6-0   Suture material:  Prolene   Suture technique:  Simple interrupted   Number of sutures:  4 Approximation:    Approximation:  Loose Post-procedure details:    Dressing:  Open (no dressing)   Patient tolerance of procedure:  Tolerated well, no immediate complications    Critical Care performed: No  ____________________________________________   INITIAL IMPRESSION / ASSESSMENT AND PLAN / ED COURSE  As part of my medical decision making, I reviewed the following data within the electronic MEDICAL RECORD NUMBER Notes from prior ED visits and [place] Controlled Substance Database  Sheriff's department was present to take report.  This report will be turned into animal control on Monday.  After consideration and the unlikelihood that this dog will be found patient was given prophylaxis rabies vaccine.  Patient is aware that he will need to return in the dates for his returns was listed on his discharge papers.  He is to keep the area clean and dry.  Patient was discharged with prescription for  Augmentin 875 twice daily for 7 days and Percocet as needed for pain.  Patient  is aware that sutures will need to be removed.  He will follow-up with his PCP or return to the emergency department for that as well.  ____________________________________________   FINAL CLINICAL IMPRESSION(S) / ED DIAGNOSES  Final diagnoses:  Dog bite of scalp, initial encounter  Dog bite of left ear, initial encounter  Need for rabies vaccination     ED Discharge Orders        Ordered    amoxicillin-clavulanate (AUGMENTIN) 875-125 MG tablet  2 times daily     09/07/17 1201    oxyCODONE-acetaminophen (PERCOCET) 5-325 MG tablet  Every 6 hours PRN     09/07/17 1201       Note:  This document was prepared using Dragon voice recognition software and may include unintentional dictation errors.    Tommi Rumps, PA-C 09/07/17 1543    Minna Antis, MD 09/08/17 1246

## 2017-09-07 NOTE — ED Notes (Signed)
See triage note  States he was setting and checking his turtle lines late last night  When he bent down to get the turtle from line a stray dog ran up  He states he tried to bush the dog away and the dog bit him  States he has petted the dog earlier w/o any problems

## 2017-09-07 NOTE — ED Triage Notes (Signed)
States unknown dog bit him behind L ear yesterday.

## 2017-09-20 ENCOUNTER — Encounter: Payer: Self-pay | Admitting: General Practice

## 2017-10-14 DIAGNOSIS — M5440 Lumbago with sciatica, unspecified side: Secondary | ICD-10-CM | POA: Diagnosis not present

## 2017-10-14 DIAGNOSIS — I739 Peripheral vascular disease, unspecified: Secondary | ICD-10-CM | POA: Diagnosis not present

## 2017-10-14 DIAGNOSIS — F419 Anxiety disorder, unspecified: Secondary | ICD-10-CM | POA: Diagnosis not present

## 2017-10-14 DIAGNOSIS — K469 Unspecified abdominal hernia without obstruction or gangrene: Secondary | ICD-10-CM | POA: Diagnosis not present

## 2017-10-14 DIAGNOSIS — F329 Major depressive disorder, single episode, unspecified: Secondary | ICD-10-CM | POA: Diagnosis not present

## 2017-10-14 DIAGNOSIS — Z0000 Encounter for general adult medical examination without abnormal findings: Secondary | ICD-10-CM | POA: Diagnosis not present

## 2017-10-17 ENCOUNTER — Encounter: Payer: Self-pay | Admitting: Surgery

## 2017-10-17 ENCOUNTER — Ambulatory Visit (INDEPENDENT_AMBULATORY_CARE_PROVIDER_SITE_OTHER): Payer: Medicare HMO | Admitting: Surgery

## 2017-10-17 VITALS — BP 113/55 | HR 69 | Temp 97.7°F | Ht 68.0 in | Wt 114.0 lb

## 2017-10-17 DIAGNOSIS — F515 Nightmare disorder: Secondary | ICD-10-CM

## 2017-10-17 DIAGNOSIS — F4310 Post-traumatic stress disorder, unspecified: Secondary | ICD-10-CM | POA: Insufficient documentation

## 2017-10-17 DIAGNOSIS — K4090 Unilateral inguinal hernia, without obstruction or gangrene, not specified as recurrent: Secondary | ICD-10-CM

## 2017-10-17 DIAGNOSIS — M1710 Unilateral primary osteoarthritis, unspecified knee: Secondary | ICD-10-CM | POA: Insufficient documentation

## 2017-10-17 DIAGNOSIS — F319 Bipolar disorder, unspecified: Secondary | ICD-10-CM | POA: Insufficient documentation

## 2017-10-17 NOTE — Patient Instructions (Addendum)
Please give us a call in case you have any questions or concerns.   Inguinal Hernia, Adult An inguinal hernia is when fat or the intestines push through the area where the leg meets the lower belly (groin) and make a rounded lump (bulge). This condition happens over time. There are three types of inguinal hernias. These types include:  Hernias that can be pushed back into the belly (are reducible).  Hernias that cannot be pushed back into the belly (are incarcerated).  Hernias that cannot be pushed back into the belly and lose their blood supply (get strangulated). This type needs emergency surgery.  Follow these instructions at home: Lifestyle  Drink enough fluid to keep your urine (pee) clear or pale yellow.  Eat plenty of fruits, vegetables, and whole grains. These have a lot of fiber. Talk with your doctor if you have questions.  Avoid lifting heavy objects.  Avoid standing for long periods of time.  Do not use tobacco products. These include cigarettes, chewing tobacco, or e-cigarettes. If you need help quitting, ask your doctor.  Try to stay at a healthy weight. General instructions  Do not try to force the hernia back in.  Watch your hernia for any changes in color or size. Let your doctor know if there are any changes.  Take over-the-counter and prescription medicines only as told by your doctor.  Keep all follow-up visits as told by your doctor. This is important. Contact a doctor if:  You have a fever.  You have new symptoms.  Your symptoms get worse. Get help right away if:  The area where the legs meets the lower belly has: ? Pain that gets worse suddenly. ? A bulge that gets bigger suddenly and does not go down. ? A bulge that turns red or purple. ? A bulge that is painful to the touch.  You are a man and your scrotum: ? Suddenly feels painful. ? Suddenly changes in size.  You feel sick to your stomach (nauseous) and this feeling does not go  away.  You throw up (vomit) and this keeps happening.  You feel your heart beating a lot more quickly than normal.  You cannot poop (have a bowel movement) or pass gas. This information is not intended to replace advice given to you by your health care provider. Make sure you discuss any questions you have with your health care provider. Document Released: 03/15/2006 Document Revised: 07/21/2015 Document Reviewed: 12/23/2013 Elsevier Interactive Patient Education  2018 [employer]  

## 2017-10-17 NOTE — Progress Notes (Signed)
Surgical Clinic History and Physical  Referring provider:  Corky DownsMasoud, Javed, MD [address] [place], [place] [id number]  HISTORY OF PRESENT ILLNESS (HPI):  67 y.o. male presents for evaluation of his Left inguinal hernia. 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 He otherwise denies any Left groin or abdominal pain, abdominal distention, or N/V with +flatus and BM's and requests information and guidance regarding management options. Of note, patient also states he's always been very thin and denies any recent weight loss.  PAST MEDICAL HISTORY (PMH):  Past Medical History:  Diagnosis Date  . Affective bipolar disorder (HCC)   . Anxiety   . Claudication, class I (HCC)   . Depression   . Left inguinal hernia   . Lumbago   . Nightmares associated with chronic post-traumatic stress disorder   . Osteoarthritis      PAST SURGICAL HISTORY [facility](PSH):  Past Surgical History:  Procedure Laterality Date  . BACK SURGERY    . CERVICAL SPINE SURGERY       MEDICATIONS:  Prior to Admission medications   Medication Sig Start Date End Date Taking? Authorizing Provider  FLUoxetine (PROZAC) 10 MG capsule Take 30 mg by mouth daily.   Yes [provider]     ALLERGIES:  No Known Allergies   SOCIAL HISTORY:  Social History   Socioeconomic History  . Marital status: Married    Spouse name: Not on file  . Number of children: Not on file  . Years of education: Not on file  . Highest education level: Not on file  Occupational History  . Not on file  Social Needs  . Financial resource strain: Not on file  . Food insecurity:    Worry: Not on file    Inability: Not on file  .  Transportation needs:    Medical: Not on file    Non-medical: Not on file  Tobacco Use  . Smoking status: Current Every Day Smoker    Types: Cigarettes  . Smokeless tobacco: Never Used  Substance and Sexual Activity  . Alcohol use: No  . Drug use: Yes    Types: Marijuana  . Sexual activity: Not on file  Lifestyle  . Physical activity:    Days per week: Not on file    Minutes per session: Not on file  . Stress: Not on file  Relationships  . Social connections:    Talks on phone: Not on file    Gets together: Not on file    Attends religious service: Not on file    Active member of club or organization: Not on file    Attends meetings of clubs or organizations: Not on file    Relationship status: Not on file  . Intimate partner violence:    Fear of current or ex partner: Not on file    Emotionally abused: Not on file    Physically abused: Not on file    Forced sexual activity: Not on file  Other Topics Concern  . Not on file  Social History Narrative  . Not on file    The patient currently resides (home / rehab facility / nursing home): Home The patient normally is (ambulatory /  bedbound): Ambulatory  FAMILY HISTORY:  Family History  Problem Relation Age of Onset  . Heart disease Mother   . Cancer Neg Hx     Otherwise negative/non-contributory.  REVIEW OF SYSTEMS:  Constitutional: denies any other weight loss, fever, chills, or sweats  Eyes: denies any other vision changes, history of eye injury  ENT: denies sore throat, hearing problems  Respiratory: denies shortness of breath, wheezing  Cardiovascular: denies chest pain, palpitations  Gastrointestinal: abdominal pain, N/V, and bowel function as per HPI Musculoskeletal: denies any other joint pains or cramps  Skin: Denies any other rashes or skin discolorations Neurological: denies any other headache, dizziness, weakness  Psychiatric: Denies any other depression, anxiety   All other review of systems were  otherwise negative   VITAL SIGNS:  BP (!) 113/55   Pulse 69   Temp 97.7 F (36.5 C) (Temporal)   Ht 5\' 8"  (1.727 m)   Wt 114 lb (51.7 kg)   BMI 17.33 kg/m   PHYSICAL EXAM:  Constitutional:  -- Very thin body habitus  -- Awake, alert, and oriented x3  Eyes:  -- Pupils equally round and reactive to light  -- No scleral icterus  Ear, nose, throat:  -- No jugular venous distension -- No nasal drainage, bleeding Pulmonary:  -- No crackles  -- Equal breath sounds bilaterally -- Breathing non-labored at rest Cardiovascular:  -- S1, S2 present  -- No pericardial rubs  Gastrointestinal:  -- Abdomen soft, nontender, non-distended, no guarding/rebound -- Unable to appreciate inguinal hernia on Left or Right side, verified by patient -- No abdominal masses appreciated, pulsatile or otherwise  Musculoskeletal and Integumentary:  -- Wounds or skin discoloration: None appreciated -- Extremities: B/L UE and LE FROM, hands and feet warm, no edema  Neurologic:  -- Motor function: Intact and symmetric -- Sensation: Intact and symmetric  Labs:  CBC: No results found for: WBC, RBC BMP: No results found for: GLUCOSE, POTASSIUM, CHLORIDE, CO2, BUN, CREATININE, CALCIUM   Imaging studies: No new pertinent imaging studies available for review   Assessment/Plan: (ICD-10's: K40.90) 67 y.o. male with essentially asymptomatic non-detectable, but reportedly spontaneously reducing, Left inguinal hernia, complicated by pertinent comorbidities including PAD with claudication, chronic lower back pain, osteoarthritis, generalized anxiety disorder, PTSD, bipolar disorder, and chronic ongoing tobacco abuse (smoking).               - discussed with patient signs and symptoms of hernia incarceration and obstruction             - strategies for manual self-reduction of patient's hernia were also reviewed and discussed  - maintain hydration with high fiber heart healthy diet to reduce/minimize constipation  +/- daily stool softener as needed             - all risks, benefits, and alternatives to repair of Left inguinal hernia with mesh were discussed with the patient, all of his questions were answered to patient's expressed satisfaction, patient expresses he does not wish to proceed with repair at this time.             - return to clinic as needed, instructed to call if any questions or concerns  - smoking cessation encouraged and discussed  All of the above recommendations were discussed with the patient, and all of patient's questions were answered to his expressed satisfaction.  Thank you for the opportunity to participate in this patient's care.  -- Scherrie Gerlach Earlene Plater, MD, RPVI [place]: [facility] General  Surgery - Partnering for exceptional care. Office: [phone]

## 2017-10-21 ENCOUNTER — Encounter: Payer: Self-pay | Admitting: Surgery

## 2017-10-24 ENCOUNTER — Ambulatory Visit (INDEPENDENT_AMBULATORY_CARE_PROVIDER_SITE_OTHER): Payer: Medicare HMO | Admitting: Psychiatry

## 2017-10-24 ENCOUNTER — Other Ambulatory Visit: Payer: Self-pay

## 2017-10-24 ENCOUNTER — Other Ambulatory Visit: Payer: Self-pay | Admitting: Psychiatry

## 2017-10-24 ENCOUNTER — Encounter: Payer: Self-pay | Admitting: Psychiatry

## 2017-10-24 VITALS — BP 135/76 | HR 62 | Temp 97.7°F | Wt 115.2 lb

## 2017-10-24 DIAGNOSIS — F3162 Bipolar disorder, current episode mixed, moderate: Secondary | ICD-10-CM

## 2017-10-24 DIAGNOSIS — F4310 Post-traumatic stress disorder, unspecified: Secondary | ICD-10-CM

## 2017-10-24 DIAGNOSIS — F1021 Alcohol dependence, in remission: Secondary | ICD-10-CM | POA: Diagnosis not present

## 2017-10-24 DIAGNOSIS — F17200 Nicotine dependence, unspecified, uncomplicated: Secondary | ICD-10-CM | POA: Diagnosis not present

## 2017-10-24 DIAGNOSIS — F5105 Insomnia due to other mental disorder: Secondary | ICD-10-CM | POA: Diagnosis not present

## 2017-10-24 MED ORDER — FLUOXETINE HCL 40 MG PO CAPS: 40.0000 mg | ORAL_CAPSULE | Freq: Every day | ORAL | 3 refills | Status: DC

## 2017-10-24 MED ORDER — OLANZAPINE 5 MG PO TABS: 5.0000 mg | ORAL_TABLET | Freq: Every day | ORAL | 1 refills | Status: DC

## 2017-10-24 NOTE — Patient Instructions (Signed)
Olanzapine tablets What is this medicine? OLANZAPINE (oh LAN za peen) is used to treat schizophrenia, psychotic disorders, and bipolar disorder. Bipolar disorder is also known as manic-depression. This medicine may be used for other purposes; ask your health care provider or pharmacist if you have questions. COMMON BRAND NAME(S): Zyprexa What should I tell my health care provider before I take this medicine? They need to know if you have any of these conditions: -breast cancer or history of breast cancer -cigarette smoker -dementia -diabetes mellitus, high blood sugar or a family history of diabetes -difficulty swallowing -glaucoma -heart disease, irregular heartbeat, or previous heart attack -history of brain tumor or head injury -kidney or liver disease -low blood pressure or dizziness when standing up -Parkinson's disease -prostate trouble -seizures (convulsions) -suicidal thoughts, plans, or attempt by you or a family member -an unusual or allergic reaction to olanzapine, other medicines, foods, dyes, or preservatives -pregnant or trying to get pregnant -breast-feeding How should I use this medicine? Take this medicine by mouth. Swallow it with a drink of water. Follow the directions on the prescription label. Take your medicine at regular intervals. Do not take it more often than directed. Do not stop taking except on the advice of your doctor or health care professional. A special MedGuide will be given to you by the pharmacist with each new prescription and refill. Be sure to read this information carefully each time. Talk to your pediatrician regarding the use of this medicine in children. While this drug may be prescribed for children as young as 13 years for selected conditions, precautions do apply. Overdosage: If you think you have taken too much of this medicine contact a poison control center or emergency room at once. NOTE: This medicine is only for you. Do not share this  medicine with others. What if I miss a dose? If you miss a dose, take it as soon as you can. If it is almost time for your next dose, take only that dose. Do not take double or extra doses. What may interact with this medicine? Do not take this medicine with any of the following medications: -certain antibiotics like grepafloxacin and sparfloxacin -certain phenothiazines like chlorpromazine, mesoridazine, and thioridazine -cisapride -clozapine -droperidol -halofantrine -levomethadyl -pimozide This medicine may also interact with the following medications: -carbamazepine -charcoal -fluvoxamine -levodopa and other medicines for Parkinson's disease -medicines for diabetes -medicines for high blood pressure -medicines for mental depression, anxiety, other mood disorders, or sleeping problems -omeprazole -rifampin -ritonavir -tobacco from cigarettes This list may not describe all possible interactions. Give your health care provider a list of all the medicines, herbs, non-prescription drugs, or dietary supplements you use. Also tell them if you smoke, drink alcohol, or use illegal drugs. Some items may interact with your medicine. What should I watch for while using this medicine? Visit your doctor or health care professional for regular checks on your progress. It may be several weeks before you see the full effects of this medicine. Notify your doctor or health care professional if your symptoms get worse, if you have new symptoms, if you are having an unusual effect from this medicine, or if you feel out of control, very discouraged or think you might harm yourself or others. Do not suddenly stop taking this medicine. You may need to gradually reduce the dose. Ask your doctor or health care professional for advice. You may get dizzy or drowsy. Do not drive, use machinery, or do anything that needs mental alertness until you know   how this medicine affects you. Do not stand or sit up  quickly, especially if you are an older patient. This reduces the risk of dizzy or fainting spells. Avoid alcoholic drinks. Alcohol can increase dizziness and drowsiness with olanzapine. Do not treat yourself for colds, diarrhea or allergies without asking your doctor or health care professional for advice. Some ingredients can increase possible side effects. Your mouth may get dry. Chewing sugarless gum or sucking hard candy, and drinking plenty of water will help. This medicine can reduce the response of your body to heat or cold. Dress warm in cold weather and stay hydrated in hot weather. If possible, avoid extreme temperatures like saunas, hot tubs, very hot or cold showers, or activities that can cause dehydration such as vigorous exercise. If you notice an increased hunger or thirst, different from your normal hunger or thirst, or if you find that you have to urinate more frequently, you should contact your health care provider as soon as possible. You may need to have your blood sugar monitored. This medicine may cause changes in your blood sugar levels. You should monitor you blood sugar frequently if you have diabetes. If you smoke, tell your doctor if you notice this medicine is not working well for you. Talk to your doctor if you are a smoker or if you decide to stop smoking. What side effects may I notice from receiving this medicine? Side effects that you should report to your doctor or health care professional as soon as possible: -allergic reactions like skin rash, itching or hives, swelling of the face, lips, or tongue -breathing problems -difficulty in speaking or swallowing -excessive thirst and/or hunger -fast heartbeat (palpitations) -fever or chills, sore throat -fever with rash, swollen lymph nodes, or swelling of the face -frequently needing to urinate -inability to control muscle movements in the face, hands, arms, or legs -painful or prolonged erections -redness,  blistering, peeling or loosening of the skin, including inside the mouth -restlessness or need to keep moving -seizures (convulsions) -stiffness, spasms -tremors or trembling Side effects that usually do not require medical attention (report to your doctor or health care professional if they continue or are bothersome): -changes in sexual desire -constipation -drowsiness -lowered blood pressure This list may not describe all possible side effects. Call your doctor for medical advice about side effects. You may report side effects to FDA at 1-800-FDA-1088. Where should I keep my medicine? Keep out of the reach of children. Store at controlled room temperature between 15 and 30 degrees C (59 and 86 degrees F). Protect from light and moisture. Throw away any unused medicine after the expiration date. NOTE: This sheet is a summary. It may not cover all possible information. If you have questions about this medicine, talk to your doctor, pharmacist, or health care provider.  2018 [employer]vier/Gold Standard (2014-07-06 17:33:14)  

## 2017-10-24 NOTE — Progress Notes (Signed)
Psychiatric Initial Adult Assessment   Patient Identification: Hector George MRN:  [id number] Date of Evaluation:  10/24/2017 Referral Source: Corky Downs MD Chief Complaint:  ' I am here to establish care.' Chief Complaint    Establish Care; Anxiety; Depression; Agitation; Other     Visit Diagnosis:    ICD-10-CM   1. Bipolar 1 disorder, mixed, moderate (HCC) F31.62 OLANZapine (ZYPREXA) 5 MG tablet  2. PTSD (post-traumatic stress disorder) F43.10   3. Insomnia due to mental condition F51.05 OLANZapine (ZYPREXA) 5 MG tablet  4. Alcohol use disorder, severe, in sustained remission (HCC) F10.21   5. Tobacco use disorder F17.200     History of Present Illness:  Hector George is a 67 yr old CM, employed , married ,lives in [place] , has a hx of mood lability , anxiety , chronic pain , Inguinal hernia , presented to the clinic today to establish care.  Patient reports a history of being diagnosed with bipolar disorder and PTSD in the past.  He reports he used to follow up with a psychiatrist in the past at the [facility].  Patient reports he has been tried on several mood stabilizers in the past but he does not remember the names.  Patient reports he is currently on Prozac which he increased by himself to 40 mg.  He however reports the Prozac as not really helpful.  He reports he struggles with mood lability on a regular basis.  He describes his mood symptoms as feeling hyper, high energy, grandiose, impulsivity, hyperverbal and so on which last for a day and then he crashes and get depressed.  He describes his depressive symptoms as sadness, lack of motivation, anhedonia, inability to get out of his bed, suicidality and so on.  Patient reports his mood symptoms can go up and down every other day or so.  Patient reports most recently his mood symptoms has been worsening.  He however reports that the last time he was suicidal was 2 months ago.  Patient reports a history of having suicide ideation, plan as well as suicide  attempts.  Patient reports a history of being traumatized at his work.  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  He has seen cranes crashing as well as people falling and getting injured and so on.  Patient reports he has been injured several times at work to the point that all of his bones in his body has been crushed in the past.  Patient reports he continues to get nightmares, intrusive memories and anxiety attacks from the same.  Patient reports he struggles with sleep problems on a regular basis.  Patient reports he has tried medications in the past but he does not remember the names.  Patient denies any perceptual disturbances.  Patient denies any auditory hallucinations or visual hallucinations or paranoia.  Patient report does report a history of substance abuse problems.  He reports he has abused every drug known to man kind in the past.  Patient however reports he quit using them a long time ago.  He reports he struggled with alcoholism however was admitted to the [facility] in [place] 2 years ago for a residential treatment program and he has been sober ever since.  Patient currently smokes cannabis at least a joint a day.  He has been doing so since the past several years.     Associated Signs/Symptoms: Depression Symptoms:  depressed mood, insomnia, psychomotor agitation, psychomotor retardation, anxiety, disturbed  sleep, (Hypo) Manic Symptoms:  Distractibility, Elevated Mood, Impulsivity, Irritable Mood, Labiality of Mood, Anxiety Symptoms:  Excessive Worry, Psychotic Symptoms:  denies at this time. PTSD Symptoms: Had a traumatic exposure:  as noted above  Past Psychiatric History: Patient reports a history of bipolar disorder, PTSD.  Patient reports he used to be under the care of the [facility] in the past.  Patient reports several admissions in the past at [facility], [facility], [facility] [place].  Patient reports 2 years ago he was  admitted at the [facility] in [place] , for residential treatment for alcohol abuse.  Patient reports he used to see a psychiatrist at the [facility].  He reports he stopped going to them.  Patient does report at least 4-5 suicide attempts in the past 20-30 years ago.  Patient reports he has shot himself but missed, overdosed on pills, tried to kill himself with a trash bag over his head and so on.    Previous Psychotropic Medications: Yes Past trials of medications like lithium, prozac, gabapentin and several other medications but does not remember the names.  Substance Abuse History in the last 12 months:  Yes.  History of alcoholism currently in remission, currently smokes cannabis on a daily basis-since the past several years.  Patient also reports abusing every drug known to man kind in the past but he stopped several years ago.  Consequences of Substance Abuse: Negative  Past Medical History:  Past Medical History:  Diagnosis Date  . Affective bipolar disorder (HCC)   . Anxiety   . Claudication, class I (HCC)   . Depression   . Left inguinal hernia   . Lumbago   . Nightmares associated with chronic post-traumatic stress disorder   . Osteoarthritis     Past Surgical History:  Procedure Laterality Date  . BACK SURGERY    . CERVICAL SPINE SURGERY    . NOSE SURGERY      Family Psychiatric History: Father has PTSD, Mother - Depression.  Family History:  Family History  Problem Relation Age of Onset  . Heart disease Mother   . Anxiety disorder Brother   . Depression Brother   . Anxiety disorder Sister   . Depression Sister   . Cancer Neg Hx     Social History:   Social History   Socioeconomic History  . Marital status: Married    Spouse name: ruth  . Number of children: 1  . Years of education: Not on file  . Highest education level: Some college, no degree  Occupational History  . Not on file  Social Needs  . Financial resource strain: Hard  . Food insecurity:     Worry: Sometimes true    Inability: Sometimes true  . Transportation needs:    Medical: Yes    Non-medical: Yes  Tobacco Use  . Smoking status: Current Every Day Smoker    Packs/day: 1.00    Types: Cigarettes  . Smokeless tobacco: Never Used  Substance and Sexual Activity  . Alcohol use: No  . Drug use: Yes    Types: Marijuana    Comment: daily  . Sexual activity: Yes  Lifestyle  . Physical activity:    Days per week: 0 days    Minutes per session: 0 min  . Stress: Very much  Relationships  . Social connections:    Talks on phone: Not on file    Gets together: Not on file    Attends religious service: Never  Active member of club or organization: No    Attends meetings of clubs or organizations: Never    Relationship status: Married  Other Topics Concern  . Not on file  Social History Narrative  . Not on file    Additional Social History: Patient is married, employed , lives in [place]. 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  He used to work in [employer] in the past.  He worked in [employer] for 2 years from 1968-1970.  Patient reports he was an [employer].  He had an honorable discharge.  Allergies:  No Known Allergies  Metabolic Disorder Labs: No results found for: HGBA1C, MPG No results found for: PROLACTIN No results found for: CHOL, TRIG, HDL, CHOLHDL, VLDL, LDLCALC   Current Medications: Current Outpatient Medications  Medication Sig Dispense Refill  . FLUoxetine (PROZAC) 40 MG capsule Take 1 capsule (40 mg total) by mouth daily. Pt has supplies 30 capsule 3  . OLANZapine (ZYPREXA) 5 MG tablet Take 1 tablet (5 mg total) by mouth at bedtime. 30 tablet 1   No current facility-administered medications for this visit.     Neurologic: Headache: No Seizure: No Paresthesias:No  Musculoskeletal: Strength & Muscle Tone: within normal limits Gait & Station: normal Patient leans: N/A  Psychiatric Specialty Exam: Review of Systems   Psychiatric/Behavioral: Positive for depression and substance abuse. The patient is nervous/anxious and has insomnia.   All other systems reviewed and are negative.   Blood pressure 135/76, pulse 62, temperature 97.7 F (36.5 C), temperature source Oral, weight 115 lb 3.2 oz (52.3 kg).Body mass index is 17.52 kg/m.  General Appearance: Casual  Eye Contact:  Fair  Speech:  Clear and Coherent  Volume:  Normal  Mood:  Anxious and Dysphoric  Affect:  Congruent  Thought Process:  Goal Directed and Descriptions of Associations: Intact  Orientation:  Full (Time, Place, and Person)  Thought Content:  Logical  Suicidal Thoughts:  No  Homicidal Thoughts:  No  Memory:  Immediate;   Fair Recent;   Fair Remote;   Fair  Judgement:  Fair  Insight:  Fair  Psychomotor Activity:  Normal  Concentration:  Concentration: Fair and Attention Span: Fair  Recall:  [employer] of Knowledge:Fair  Language: Fair  Akathisia:  No  Handed:  Right  AIMS (if indicated): na  Assets:  Communication Skills Desire for Improvement Housing Intimacy Social Support  ADL's:  Intact  Cognition: WNL  Sleep:  poor    Treatment Plan Summary: Jr is a 67 year old Caucasian male, married, employed, lives in [place], has a history of bipolar disorder, PTSD, chronic pain, inguinal hernia, cannabis use disorder, tobacco use disorder, alcohol use disorder in remission, presented to the clinic today to establish care.  Patient is biologically predisposed given his family history of mental health problems, history of trauma as well as substance abuse problems.  Patient currently denies any suicidality however has a history of suicide attempts in the past.  Patient denies access to guns.  Pt has good social support system.  [facility] well as go to the nearest emergency department.  Patient contracts for safety and is motivated to start treatment and stay on medications.  Plan as  noted below.  Medication management and Plan as noted below   For bipolar disorder Add Zyprexa 5 mg p.o. nightly Continue Prozac 40 mg p.o. daily new  For PTSD Add Zyprexa 5 mg p.o. nightly  Continue Prozac 40 mg p.o. daily. Refer patient for psychotherapy.  Insomnia Discussed with patient that Zyprexa can also help with sleep.  Will discuss more options during follow-up sessions.  Alcohol use disorder in sustained remission Patient continues to stay sober  For tobacco use disorder Provided smoking cessation counseling.  Patient reports he is not ready to quit.  Discussed getting the following labs- will order- TSH, lipid panel, CBC with differential, CMP, prolactin, hemoglobin A1c, vitamin D and vitamin B12.  Discussed with patient to sign a release to obtain medical records from his previous psychiatrist at the [facility].  Follow-up in clinic in 3-4 weeks or sooner if needed.  More than 50 % of the time was spent for psychoeducation and supportive psychotherapy and care coordination.  This note was generated in part or whole with voice recognition software. Voice recognition is usually quite accurate but there are transcription errors that can and very often do occur. I apologize for any typographical errors that were not detected and corrected.     Jomarie Longs, MD 8/29/20193:00 PM

## 2017-10-25 LAB — CBC WITH DIFFERENTIAL/PLATELET
BASOS ABS: 0 10*3/uL (ref 0.0–0.2)
BASOS: 0 %
EOS (ABSOLUTE): 0.1 10*3/uL (ref 0.0–0.4)
Eos: 1 %
HEMOGLOBIN: 14 g/dL (ref 13.0–17.7)
Hematocrit: 41.2 % (ref 37.5–51.0)
IMMATURE GRANS (ABS): 0 10*3/uL (ref 0.0–0.1)
IMMATURE GRANULOCYTES: 0 %
LYMPHS: 32 %
Lymphocytes Absolute: 2.6 10*3/uL (ref 0.7–3.1)
MCH: 29.8 pg (ref 26.6–33.0)
MCHC: 34 g/dL (ref 31.5–35.7)
MCV: 88 fL (ref 79–97)
MONOCYTES: 6 %
Monocytes Absolute: 0.5 10*3/uL (ref 0.1–0.9)
NEUTROS PCT: 61 %
Neutrophils Absolute: 5 10*3/uL (ref 1.4–7.0)
PLATELETS: 258 10*3/uL (ref 150–450)
RBC: 4.7 x10E6/uL (ref 4.14–5.80)
RDW: 13.3 % (ref 12.3–15.4)
WBC: 8.3 10*3/uL (ref 3.4–10.8)

## 2017-10-25 LAB — COMPREHENSIVE METABOLIC PANEL
A/G RATIO: 2.3 — AB (ref 1.2–2.2)
ALK PHOS: 78 IU/L (ref 39–117)
ALT: 9 IU/L (ref 0–44)
AST: 17 IU/L (ref 0–40)
Albumin: 4.5 g/dL (ref 3.6–4.8)
BILIRUBIN TOTAL: 0.7 mg/dL (ref 0.0–1.2)
BUN/Creatinine Ratio: 19 (ref 10–24)
BUN: 16 mg/dL (ref 8–27)
CHLORIDE: 100 mmol/L (ref 96–106)
CO2: 24 mmol/L (ref 20–29)
Calcium: 9.2 mg/dL (ref 8.6–10.2)
Creatinine, Ser: 0.83 mg/dL (ref 0.76–1.27)
GFR calc Af Amer: 105 mL/min/{1.73_m2} (ref >59)
GFR calc non Af Amer: 90 mL/min/{1.73_m2} (ref >59)
GLUCOSE: 90 mg/dL (ref 65–99)
Globulin, Total: 2 g/dL (ref 1.5–4.5)
Potassium: 4.3 mmol/L (ref 3.5–5.2)
Sodium: 140 mmol/L (ref 134–144)
Total Protein: 6.5 g/dL (ref 6.0–8.5)

## 2017-10-25 LAB — LIPID PANEL W/O CHOL/HDL RATIO
CHOLESTEROL TOTAL: 172 mg/dL (ref 100–199)
HDL: 54 mg/dL (ref >39)
LDL Calculated: 103 mg/dL — ABNORMAL HIGH (ref 0–99)
TRIGLYCERIDES: 75 mg/dL (ref 0–149)
VLDL Cholesterol Cal: 15 mg/dL (ref 5–40)

## 2017-10-25 LAB — VITAMIN D 25 HYDROXY (VIT D DEFICIENCY, FRACTURES): VIT D 25 HYDROXY: 39.2 ng/mL (ref 30.0–100.0)

## 2017-10-25 LAB — B12 AND FOLATE PANEL
FOLATE: 19.2 ng/mL (ref >3.0)
Vitamin B-12: 648 pg/mL (ref 232–1245)

## 2017-10-25 LAB — PROLACTIN: PROLACTIN: 5.6 ng/mL (ref 4.0–15.2)

## 2017-10-25 LAB — HGB A1C W/O EAG: Hgb A1c MFr Bld: 5.6 % (ref 4.8–5.6)

## 2017-10-25 LAB — TSH: TSH: 0.951 u[IU]/mL (ref 0.450–4.500)

## 2017-10-29 ENCOUNTER — Telehealth: Payer: Self-pay | Admitting: Surgery

## 2017-10-29 NOTE — Telephone Encounter (Signed)
Patient came into the office and states he would like to schedule with Dr Earlene Plater for Hernia surgery. He states his pain has increased and is ready. I told him you would be contacting him.[phone] wife Kyden Tarrence) & (his cell [phone]) he does not answer his phone on a regular basics.

## 2017-11-02 ENCOUNTER — Emergency Department (HOSPITAL_COMMUNITY)
Admission: EM | Admit: 2017-11-02 | Discharge: 2017-11-02 | Disposition: A | Discharge status: Home / Self Care | Payer: Medicare HMO | Attending: Emergency Medicine | Admitting: Emergency Medicine

## 2017-11-02 ENCOUNTER — Emergency Department (HOSPITAL_COMMUNITY): Payer: Medicare HMO

## 2017-11-02 ENCOUNTER — Encounter (HOSPITAL_COMMUNITY): Payer: Self-pay | Admitting: Emergency Medicine

## 2017-11-02 DIAGNOSIS — R1032 Left lower quadrant pain: Secondary | ICD-10-CM | POA: Insufficient documentation

## 2017-11-02 DIAGNOSIS — F17210 Nicotine dependence, cigarettes, uncomplicated: Secondary | ICD-10-CM | POA: Diagnosis not present

## 2017-11-02 DIAGNOSIS — Z79899 Other long term (current) drug therapy: Secondary | ICD-10-CM | POA: Insufficient documentation

## 2017-11-02 DIAGNOSIS — R109 Unspecified abdominal pain: Secondary | ICD-10-CM | POA: Diagnosis not present

## 2017-11-02 DIAGNOSIS — R1030 Lower abdominal pain, unspecified: Secondary | ICD-10-CM | POA: Diagnosis not present

## 2017-11-02 LAB — CBC WITH DIFFERENTIAL/PLATELET
Basophils Absolute: 0 10*3/uL (ref 0.0–0.1)
Basophils Relative: 0 %
EOS ABS: 0.1 10*3/uL (ref 0.0–0.7)
EOS PCT: 1 %
HCT: 40.1 % (ref 39.0–52.0)
Hemoglobin: 13.3 g/dL (ref 13.0–17.0)
Lymphocytes Relative: 23 %
Lymphs Abs: 2.1 10*3/uL (ref 0.7–4.0)
MCH: 29.8 pg (ref 26.0–34.0)
MCHC: 33.2 g/dL (ref 30.0–36.0)
MCV: 89.9 fL (ref 78.0–100.0)
MONO ABS: 0.4 10*3/uL (ref 0.1–1.0)
Monocytes Relative: 5 %
Neutro Abs: 6.2 10*3/uL (ref 1.7–7.7)
Neutrophils Relative %: 71 %
PLATELETS: 226 10*3/uL (ref 150–400)
RBC: 4.46 MIL/uL (ref 4.22–5.81)
RDW: 14.3 % (ref 11.5–15.5)
WBC: 8.8 10*3/uL (ref 4.0–10.5)

## 2017-11-02 LAB — HEPATIC FUNCTION PANEL
ALK PHOS: 74 U/L (ref 38–126)
ALT: 19 U/L (ref 0–44)
AST: 23 U/L (ref 15–41)
Albumin: 4.1 g/dL (ref 3.5–5.0)
BILIRUBIN DIRECT: 0.1 mg/dL (ref 0.0–0.2)
Indirect Bilirubin: 0.6 mg/dL (ref 0.3–0.9)
Total Bilirubin: 0.7 mg/dL (ref 0.3–1.2)
Total Protein: 7 g/dL (ref 6.5–8.1)

## 2017-11-02 LAB — LIPASE, BLOOD: Lipase: 29 U/L (ref 11–51)

## 2017-11-02 LAB — BASIC METABOLIC PANEL
Anion gap: 10 (ref 5–15)
BUN: 20 mg/dL (ref 8–23)
CO2: 24 mmol/L (ref 22–32)
CREATININE: 0.69 mg/dL (ref 0.61–1.24)
Calcium: 8.8 mg/dL — ABNORMAL LOW (ref 8.9–10.3)
Chloride: 104 mmol/L (ref 98–111)
GFR calc Af Amer: >60 mL/min (ref >60)
Glucose, Bld: 103 mg/dL — ABNORMAL HIGH (ref 70–99)
POTASSIUM: 4.3 mmol/L (ref 3.5–5.1)
Sodium: 138 mmol/L (ref 135–145)

## 2017-11-02 NOTE — ED Provider Notes (Signed)
[facility] EMERGENCY DEPARTMENT Provider Note   CSN: [id number] Arrival date & time: 11/02/17  0815     History   Chief Complaint Chief Complaint  Patient presents with  . Inguinal Hernia    HPI Hector George is a 67 y.o. male.  HPI  Pt was seen at 0835.  Per pt, c/o gradual onset and persistence of constant left inguinal hernia "pain" for the past 2 days, worse since this morning approximately 0400. Pt states he has a known hernia that "goes in and out." Pt states he was pulling in a turtle when his left hernia pain "got worse."  Has been associated with no other symptoms. Pt states he was evaluated by General Surgeon and is planning on elective hernia repair. Describes the abd pain as "sore."  Denies N/V, no diarrhea, no fevers, no back pain, no rash, no CP/SOB, no black or blood in stools, no direct injury, no testicular pain/swelling, no dysuria/hematuria.       Past Medical History:  Diagnosis Date  . Affective bipolar disorder (HCC)   . Anxiety   . Claudication, class I (HCC)   . Depression   . Left inguinal hernia   . Lumbago   . Nightmares associated with chronic post-traumatic stress disorder   . Osteoarthritis     Patient Active Problem List   Diagnosis Date Noted  . Affective bipolar disorder (HCC) 10/17/2017  . Arthritis of knee 10/17/2017  . Nightmares associated with chronic post-traumatic stress disorder 10/17/2017  . Neck pain 04/12/2015  . Closed T1 spinal fracture (HCC) 07/31/2014  . Traumatic compression fracture of T3 vertebra (HCC) 07/31/2014    Past Surgical History:  Procedure Laterality Date  . BACK SURGERY    . CERVICAL SPINE SURGERY    . NOSE SURGERY          Home Medications    Prior to Admission medications   Medication Sig Start Date End Date Taking? Authorizing Provider  FLUoxetine (PROZAC) 40 MG capsule Take 1 capsule (40 mg total) by mouth daily. Pt has supplies 10/24/17   Eappen, Levin Bacon, MD  OLANZapine (ZYPREXA) 5 MG tablet  Take 1 tablet (5 mg total) by mouth at bedtime. 10/24/17   Jomarie Longs, MD    Family History Family History  Problem Relation Age of Onset  . Heart disease Mother   . Anxiety disorder Brother   . Depression Brother   . Anxiety disorder Sister   . Depression Sister   . Cancer Neg Hx     Social History Social History   Tobacco Use  . Smoking status: Current Every Day Smoker    Packs/day: 1.00    Types: Cigarettes  . Smokeless tobacco: Never Used  Substance Use Topics  . Alcohol use: No  . Drug use: Yes    Types: Marijuana    Comment: daily     Allergies   Patient has no known allergies.   Review of Systems Review of Systems ROS: Statement: All systems negative except as marked or noted in the HPI; Constitutional: Negative for fever and chills. ; ; Eyes: Negative for eye pain, redness and discharge. ; ; ENMT: Negative for ear pain, hoarseness, nasal congestion, sinus pressure and sore throat. ; ; Cardiovascular: Negative for chest pain, palpitations, diaphoresis, dyspnea and peripheral edema. ; ; Respiratory: Negative for cough, wheezing and stridor. ; ; Gastrointestinal: +left inguinal hernia pain. Negative for nausea, vomiting, diarrhea, blood in stool, hematemesis, jaundice and rectal bleeding. . ; ; Genitourinary: Negative  for dysuria, flank pain and hematuria. ; ; Genital:  No penile drainage or rash, no testicular pain or swelling, no scrotal rash or swelling. ;; Musculoskeletal: Negative for back pain and neck pain. Negative for swelling and trauma.; ; Skin: Negative for pruritus, rash, abrasions, blisters, bruising and skin lesion.; ; Neuro: Negative for headache, lightheadedness and neck stiffness. Negative for weakness, altered level of consciousness, altered mental status, extremity weakness, paresthesias, involuntary movement, seizure and syncope.       Physical Exam Updated Vital Signs BP (!) 141/87 (BP Location: Right Arm)   Pulse (!) 58   Temp 97.6 F (36.4  C) (Oral)   Resp 18   Ht 5\' 8"  (1.727 m)   Wt 51.7 kg   SpO2 100%   BMI 17.33 kg/m   Physical Exam 0840: Physical examination:  Nursing notes reviewed; Vital signs and O2 SAT reviewed;  Constitutional: Well developed, Well nourished, Well hydrated, In no acute distress; Head:  Normocephalic, atraumatic; Eyes: EOMI, PERRL, No scleral icterus; ENMT: Mouth and pharynx normal, Mucous membranes moist; Neck: Supple, Full range of motion, No lymphadenopathy; Cardiovascular: Regular rate and rhythm, No gallop; Respiratory: Breath sounds clear & equal bilaterally, No wheezes.  Speaking full sentences with ease, Normal respiratory effort/excursion; Chest: Nontender, Movement normal; Abdomen: +slight bulge left medial inguinal area, soft, NT, easily reducible; no overlying erythema, edema, ecchymosis. Genital exam performed with pt permission and ED Tech chaperone present during exam. No perineal erythema.  No penile lesions or drainage.  No scrotal erythema, edema or tenderness to palp.  Normal testicular lie.  No testicular tenderness to palp.  +cremasteric reflexes bilat.  No inguinal LAN.  Nontender, Nondistended, Normal bowel sounds; Genitourinary: No CVA tenderness; Extremities: Peripheral pulses normal, No tenderness, No edema, No calf edema or asymmetry.; Neuro: AA&Ox3, Major CN grossly intact.  Speech clear. No gross focal motor or sensory deficits in extremities. Climbs on and off stretcher easily by himself. Gait steady..; Skin: Color normal, Warm, Dry.   ED Treatments / Results  Labs (all labs ordered are listed, but only abnormal results are displayed)   EKG None  Radiology   Procedures Procedures (including critical care time)  Medications Ordered in ED Medications - No data to display   Initial Impression / Assessment and Plan / ED Course  I have reviewed the triage vital signs and the nursing notes.  Pertinent labs & imaging results that were available during my care of the  patient were reviewed by me and considered in my medical decision making (see chart for details).  MDM Reviewed: previous chart, nursing note and vitals Reviewed previous: labs Interpretation: labs and CT scan   Results for orders placed or performed during the hospital encounter of 11/02/17  CBC with Differential  Result Value Ref Range   WBC 8.8 4.0 - 10.5 K/uL   RBC 4.46 4.22 - 5.81 MIL/uL   Hemoglobin 13.3 13.0 - 17.0 g/dL   HCT 29.5 28.4 - 13.2 %   MCV 89.9 78.0 - 100.0 fL   MCH 29.8 26.0 - 34.0 pg   MCHC 33.2 30.0 - 36.0 g/dL   RDW 44.0 10.2 - 72.5 %   Platelets 226 150 - 400 K/uL   Neutrophils Relative % 71 %   Neutro Abs 6.2 1.7 - 7.7 K/uL   Lymphocytes Relative 23 %   Lymphs Abs 2.1 0.7 - 4.0 K/uL   Monocytes Relative 5 %   Monocytes Absolute 0.4 0.1 - 1.0 K/uL   Eosinophils Relative  1 %   Eosinophils Absolute 0.1 0.0 - 0.7 K/uL   Basophils Relative 0 %   Basophils Absolute 0.0 0.0 - 0.1 K/uL  Basic metabolic panel  Result Value Ref Range   Sodium 138 135 - 145 mmol/L   Potassium 4.3 3.5 - 5.1 mmol/L   Chloride 104 98 - 111 mmol/L   CO2 24 22 - 32 mmol/L   Glucose, Bld 103 (H) 70 - 99 mg/dL   BUN 20 8 - 23 mg/dL   Creatinine, Ser 1.61 0.61 - 1.24 mg/dL   Calcium 8.8 (L) 8.9 - 10.3 mg/dL   GFR calc non Af Amer >60 >60 mL/min   GFR calc Af Amer >60 >60 mL/min   Anion gap 10 5 - 15  Hepatic function panel  Result Value Ref Range   Total Protein 7.0 6.5 - 8.1 g/dL   Albumin 4.1 3.5 - 5.0 g/dL   AST 23 15 - 41 U/L   ALT 19 0 - 44 U/L   Alkaline Phosphatase 74 38 - 126 U/L   Total Bilirubin 0.7 0.3 - 1.2 mg/dL   Bilirubin, Direct 0.1 0.0 - 0.2 mg/dL   Indirect Bilirubin 0.6 0.3 - 0.9 mg/dL  Lipase, blood  Result Value Ref Range   Lipase 29 11 - 51 U/L   Ct Abdomen Pelvis Wo Contrast Result Date: 11/02/2017 CLINICAL DATA:  Abdominal pain.  Known left inguinal hernia. EXAM: CT ABDOMEN AND PELVIS WITHOUT CONTRAST TECHNIQUE: Multidetector CT imaging of the  abdomen and pelvis was performed following the standard protocol without IV contrast. COMPARISON:  None. FINDINGS: Lower chest: Mild dependent atelectasis is present. The heart size is normal. No significant pleural or pericardial effusion is present. Hepatobiliary: No focal liver abnormality is seen. No gallstones, gallbladder wall thickening, or biliary dilatation. Pancreas: Unremarkable. No pancreatic ductal dilatation or surrounding inflammatory changes. Spleen: Normal in size without focal abnormality. Adrenals/Urinary Tract: The adrenal glands are normal bilaterally. Kidneys and ureters are unremarkable. No focal mass lesion or stone is present. There is no obstructive uropathy. The urinary bladder is within normal limits. Stomach/Bowel: The stomach and duodenum are within normal limits. There is some stranding about proximal loops of small bowel without obstruction or mass lesion. The terminal ileum is within normal limits. The appendix is visualized and normal. The ascending and transverse colon are within normal limits. Moderate stool is present. Descending and sigmoid colon are unremarkable. Vascular/Lymphatic: Atherosclerotic calcifications are present in the aorta without aneurysm. No significant adenopathy is present. Reproductive: Prostatic calcifications are present. Other: No significant ventral hernia is present. There is no significant inguinal hernia visualized. No significant free fluid is present. Musculoskeletal: Levoconvex curvature of the lumbar spine is centered at L2. This results in asymmetric right-sided endplate change at L2-3. There is left-sided endplate change in sclerosis at L4-5 and L5-S1. Central canal stenosis is greatest at L4-5, left greater than right. Bony pelvis is intact. The hips are located and within normal limits bilaterally. IMPRESSION: 1. Mild inflammatory changes within the proximal small bowel and small bowel mesentery compatible with nonspecific enteritis. There is  no mass lesion or obstruction. 2. No significant hernia. 3.  Aortic Atherosclerosis (ICD10-I70.0). 4. Scoliosis.  Central canal stenosis is greatest at L4-5. Electronically Signed   By: Marin Roberts M.D.   On: 11/02/2017 09:34    1100:  Pt left the ED after being asked regarding his dog locked in his car by Registration and Police.    Final Clinical Impressions(s) / ED  Diagnoses   Final diagnoses:  None    ED Discharge Orders    None       Samuel Jester, DO 11/05/17 [id number]

## 2017-11-02 NOTE — ED Triage Notes (Signed)
Pt reports having left inguinal hernia since April which he has been able to push in.  About 2 days ago was fishing and caught a large turtle and strained.  Now is having constant pain in left groin.

## 2017-11-04 ENCOUNTER — Telehealth: Payer: Self-pay | Admitting: Surgery

## 2017-11-04 NOTE — Telephone Encounter (Signed)
Left message for patient to return call to office. 

## 2017-11-04 NOTE — Telephone Encounter (Signed)
Patient is calling about his hernia he said his hernia has popped out and is in some pain, said his pain was at a ten. Please call patient and advise.

## 2017-11-05 ENCOUNTER — Ambulatory Visit (INDEPENDENT_AMBULATORY_CARE_PROVIDER_SITE_OTHER): Payer: Medicare HMO | Admitting: Psychiatry

## 2017-11-05 ENCOUNTER — Other Ambulatory Visit: Payer: Self-pay

## 2017-11-05 ENCOUNTER — Encounter: Payer: Self-pay | Admitting: Psychiatry

## 2017-11-05 VITALS — BP 143/83 | HR 83 | Temp 97.5°F | Wt 120.4 lb

## 2017-11-05 DIAGNOSIS — F3162 Bipolar disorder, current episode mixed, moderate: Secondary | ICD-10-CM | POA: Diagnosis not present

## 2017-11-05 DIAGNOSIS — F4310 Post-traumatic stress disorder, unspecified: Secondary | ICD-10-CM | POA: Diagnosis not present

## 2017-11-05 DIAGNOSIS — F5105 Insomnia due to other mental disorder: Secondary | ICD-10-CM | POA: Diagnosis not present

## 2017-11-05 DIAGNOSIS — F1021 Alcohol dependence, in remission: Secondary | ICD-10-CM

## 2017-11-05 DIAGNOSIS — F17200 Nicotine dependence, unspecified, uncomplicated: Secondary | ICD-10-CM | POA: Diagnosis not present

## 2017-11-05 MED ORDER — DIVALPROEX SODIUM 250 MG PO DR TAB: 250.0000 mg | DELAYED_RELEASE_TABLET | Freq: Three times a day (TID) | ORAL | 1 refills | Status: DC

## 2017-11-05 MED ORDER — OLANZAPINE 10 MG PO TABS: 10.0000 mg | ORAL_TABLET | Freq: Every day | ORAL | 0 refills | Status: DC

## 2017-11-05 NOTE — Telephone Encounter (Signed)
Left message with patients wife to call office. 

## 2017-11-05 NOTE — Progress Notes (Signed)
BH MD OP Progress Note  11/05/2017 1:24 PM Hector George  MRN:  [id number]  Chief Complaint:  ' I am here for follow up." Chief Complaint    Follow-up; Medication Refill; Anxiety; Agitation     HPI: Hector George is a 67 year old Caucasian male, employed, married, lives in [place], has a history of Polar disorder, PTSD, insomnia, alcohol use disorder in remission, tobacco use disorder, chronic pain, inguinal hernia, presented to the clinic today for a follow-up visit.  Pt reports the Zyprexa as helping him to some extent .  He reports his sleep is improving.  He reports he can fall asleep easily now.  He however continues to wake up and his sleep continues to be interrupted.  He is interested in increasing his Zyprexa dosage today.  Patient continues to struggle with some irritability and mood lability.  Patient discussed his recent visit to the emergency department for inguinal hernia pain.  Patient reports he had left his dog in his car and security officers were called for the same.  He reports he was especially angry at one of the nursing staff there.  Patient however reports he had been to several anger management classes in the past.  He reports he was well able to manage his anger that day and make use of his coping skills.  He plans to stay away from that nursing staff if he has to go to the emergency department again.  He reports that security officers treated him very well and he is happy with that.  He reports he cares for his animal very well and did not like the fact that people assumed he was mistreating his dog.  Discussed referral for psychotherapy here in clinic.  He agrees with plan.  Also discussed adding Depakote as a mood stabilizer.  Discussed his most recent labs including CBC, CMP with patient.  His labs are all within normal limits.  Pt denies any suicidality.  Patient denies any homicidality.  Patient denies any perceptual disturbances.  Patient continues to smoke cigarettes and is  not ready to quit.      Visit Diagnosis:    ICD-10-CM   1. Bipolar 1 disorder, mixed, moderate (HCC) F31.62   2. PTSD (post-traumatic stress disorder) F43.10   3. Insomnia due to mental condition F51.05   4. Alcohol use disorder, severe, in sustained remission (HCC) F10.21   5. Tobacco use disorder F17.200     Past Psychiatric History: Have reviewed past psychiatric history from my progress note on 10/24/2017.  Past Medical History:  Past Medical History:  Diagnosis Date  . Affective bipolar disorder (HCC)   . Anxiety   . Claudication, class I (HCC)   . Depression   . Left inguinal hernia   . Lumbago   . Nightmares associated with chronic post-traumatic stress disorder   . Osteoarthritis     Past Surgical History:  Procedure Laterality Date  . BACK SURGERY    . CERVICAL SPINE SURGERY    . NOSE SURGERY      Family Psychiatric History: Reviewed family psychiatric history from my progress note on 10/24/2017.  Family History:  Family History  Problem Relation Age of Onset  . Heart disease Mother   . Anxiety disorder Brother   . Depression Brother   . Anxiety disorder Sister   . Depression Sister   . Cancer Neg Hx     Social History: Have reviewed social history from my progress note on 10/24/2017 Social History   Socioeconomic History  .  Marital status: Married    Spouse name: ruth  . Number of children: 1  . Years of education: Not on file  . Highest education level: Some college, no degree  Occupational History  . Not on file  Social Needs  . Financial resource strain: Hard  . Food insecurity:    Worry: Sometimes true    Inability: Sometimes true  . Transportation needs:    Medical: Yes    Non-medical: Yes  Tobacco Use  . Smoking status: Current Every Day Smoker    Packs/day: 1.00    Types: Cigarettes  . Smokeless tobacco: Never Used  Substance and Sexual Activity  . Alcohol use: No  . Drug use: Yes    Types: Marijuana    Comment: daily  .  Sexual activity: Yes  Lifestyle  . Physical activity:    Days per week: 0 days    Minutes per session: 0 min  . Stress: Very much  Relationships  . Social connections:    Talks on phone: Not on file    Gets together: Not on file    Attends religious service: Never    Active member of club or organization: No    Attends meetings of clubs or organizations: Never    Relationship status: Married  Other Topics Concern  . Not on file  Social History Narrative  . Not on file    Allergies: No Known Allergies  Metabolic Disorder Labs: Lab Results  Component Value Date   HGBA1C 5.6 10/24/2017   Lab Results  Component Value Date   PROLACTIN 5.6 10/24/2017   Lab Results  Component Value Date   CHOL 172 10/24/2017   TRIG 75 10/24/2017   HDL 54 10/24/2017   LDLCALC 103 (H) 10/24/2017   Lab Results  Component Value Date   TSH 0.951 10/24/2017    Therapeutic Level Labs: No results found for: LITHIUM No results found for: VALPROATE No components found for:  CBMZ  Current Medications: Current Outpatient Medications  Medication Sig Dispense Refill  . FLUoxetine (PROZAC) 40 MG capsule Take 1 capsule (40 mg total) by mouth daily. Pt has supplies 30 capsule 3  . divalproex (DEPAKOTE) 250 MG DR tablet Take 1 tablet (250 mg total) by mouth 3 (three) times daily. 90 tablet 1  . OLANZapine (ZYPREXA) 10 MG tablet Take 1 tablet (10 mg total) by mouth at bedtime. 30 tablet 0   No current facility-administered medications for this visit.      Musculoskeletal: Strength & Muscle Tone: within normal limits Gait & Station: normal Patient leans: N/A  Psychiatric Specialty Exam: Review of Systems  Psychiatric/Behavioral: Positive for depression. The patient is nervous/anxious and has insomnia.   All other systems reviewed and are negative.   Blood pressure (!) 143/83, pulse 83, temperature (!) 97.5 F (36.4 C), temperature source Oral, weight 120 lb 6.4 oz (54.6 kg).Body mass index  is 18.31 kg/m.  General Appearance: Casual  Eye Contact:  Fair  Speech:  Clear and Coherent  Volume:  Normal  Mood:  vaguely irritable  Affect:  Appropriate  Thought Process:  Goal Directed and Descriptions of Associations: Intact  Orientation:  Full (Time, Place, and Person)  Thought Content: Logical   Suicidal Thoughts:  No  Homicidal Thoughts:  No  Memory:  Immediate;   Fair Recent;   Fair Remote;   Fair  Judgement:  Fair  Insight:  Fair  Psychomotor Activity:  Normal  Concentration:  Concentration: Fair and Attention Span: Fair  Recall:  Jennelle Human of Knowledge: Fair  Language: Fair  Akathisia:  No  Handed:  Right  AIMS (if indicated): has bilateral chronic tremors of UE - reports he has had it for years .   Assets:  Communication Skills Desire for Improvement Social Support  ADL's:  Intact  Cognition: WNL  Sleep:  restless   Screenings:   Assessment and Plan: Hector George is a 67 year old Caucasian male, married, employed, lives in [place], has a history of bipolar disorder, PTSD, chronic pain, inguinal hernia, cannabis use disorder, tobacco use disorder, alcohol use disorder in remission, presented to the clinic today for a follow-up visit.  Patient is biologically predisposed given his family history of mental health problems, history of trauma as well as substance abuse problems.  Patient continues to have some mood lability and sleep issues.  Discussed medication management as noted below.  He will also start psychotherapy sessions here in clinic.  Plan For bipolar disorder Increase Zyprexa to 10 mg p.o. nightly Add Depakote ER 250 mg p.o. 3 times daily. Will obtain Depakote level in 1 week-provided lab slip.  PTSD Prozac 40 mg p.o. daily Patient to be referred for psychotherapy with therapist here in clinic.  For insomnia Zyprexa will also help with sleep.  For alcohol use disorder in remission Patient continues to stay sober  For tobacco use  disorder Provided smoking cessation counseling.  Patient is not ready to quit.  I have reviewed and discussed the following labs resulted on 10/24/2017 with patient-CBC with differential-within normal limits, CMP-within normal limits except for A/G ratio-2.3-high , Lipid panel-LDL cholesterol-103-slightly high, vitamin B12-648-within normal limits, folic acid-19.2-within normal limits, hemoglobin A1c-5.6-within normal limits, TSH-0.951-within normal limits, vitamin D-39.2-within normal limits, prolactin-5.6-within normal limits   Follow-up in clinic in 4 weeks or sooner if needed.  More than 50 % of the time was spent for psychoeducation and supportive psychotherapy and care coordination.  This note was generated in part or whole with voice recognition software. Voice recognition is usually quite accurate but there are transcription errors that can and very often do occur. I apologize for any typographical errors that were not detected and corrected.        Jomarie Longs, MD 11/05/2017, 1:24 PM

## 2017-11-05 NOTE — Telephone Encounter (Signed)
Spoke with patient. He stated he had been having increased pain and discomfort the last few days. However he is feeling ok today. He would like to see Dr.Davis to discuss surgery. He is added 11/05/17 @ 9:15 am.

## 2017-11-06 ENCOUNTER — Encounter: Payer: Self-pay | Admitting: Licensed Clinical Social Worker

## 2017-11-06 ENCOUNTER — Ambulatory Visit (INDEPENDENT_AMBULATORY_CARE_PROVIDER_SITE_OTHER): Payer: Medicare HMO | Admitting: Licensed Clinical Social Worker

## 2017-11-06 DIAGNOSIS — F4310 Post-traumatic stress disorder, unspecified: Secondary | ICD-10-CM

## 2017-11-06 DIAGNOSIS — F3160 Bipolar disorder, current episode mixed, unspecified: Secondary | ICD-10-CM
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
## 2017-11-07 ENCOUNTER — Encounter: Payer: Self-pay | Admitting: Surgery

## 2017-11-07 ENCOUNTER — Ambulatory Visit (INDEPENDENT_AMBULATORY_CARE_PROVIDER_SITE_OTHER): Payer: Medicare HMO | Admitting: Surgery

## 2017-11-07 VITALS — BP 141/77 | HR 60 | Temp 98.5°F | Resp 18 | Ht 68.0 in | Wt 119.8 lb

## 2017-11-07 DIAGNOSIS — R1032 Left lower quadrant pain: Secondary | ICD-10-CM

## 2017-11-07 NOTE — Progress Notes (Signed)
Surgical Clinic Progress/Follow-up Note   HPI:  67 y.o. Male presents to clinic for follow-up evaluation of his Left inguinal hernia. Patient reports he was working outside this past weekend when his Left inguinal hernia "popped out", for which he presented to [facility] ED to have his hernia reduced. Patient states his large bulging and painful hernia was reduced by physician at [facility] ED, but ED physician's note describes "slight bulge left medial inguinal area" and CT performed did not demonstrate any inguinal hernia. Patient describes +flatus with BM's WNL, denies abdominal distention, N/V, fever/chills, CP, or SOB despite continuing to smoke at least 1 pack per day. He now says he would like to have his hernia repaired.  Review of Systems:  Constitutional: denies any other weight loss, fever, chills, or sweats  Eyes: denies any other vision changes, history of eye injury  ENT: denies sore throat, hearing problems  Respiratory: denies shortness of breath, wheezing  Cardiovascular: denies chest pain, palpitations  Gastrointestinal: abdominal pain, N/V, and bowel function as per HPI Musculoskeletal: denies any other joint pains or cramps  Skin: Denies any other rashes or skin discolorations  Neurological: denies any other headache, dizziness, weakness  Psychiatric: denies any other depression, anxiety  All other review of systems: otherwise negative   Vital Signs:  BP (!) 141/77   Pulse 60   Temp 98.5 F (36.9 C) (Temporal)   Resp 18   Ht 5\' 8"  (1.727 m)   Wt 119 lb 12.8 oz (54.3 kg)   SpO2 99%   BMI 18.22 kg/m    Physical Exam:  Constitutional:  -- Underweight body habitus  -- Awake, alert, and oriented x3  Eyes:  -- Pupils equally round and reactive to light  -- No scleral icterus  Ear, nose, throat:  -- Very poor dentition -- No jugular venous distension  -- No nasal drainage, bleeding Pulmonary:  -- No crackles -- Strong odor of tobacco smoke -- Equal breath sounds  bilaterally -- Breathing non-labored at rest Cardiovascular:  -- S1, S2 present  -- No pericardial rubs  Gastrointestinal:  -- Soft, nontender, non-distended, no guarding/rebound -- B/L inguinal canals easily able to be palpated and visualized without appreciation of any inguinal hernia -- No abdominal masses appreciated, pulsatile or otherwise  Musculoskeletal / Integumentary:  -- Wounds or skin discoloration: None appreciated -- Extremities: B/L UE and LE FROM, hands and feet warm, no edema  Neurologic:  -- Motor function: intact and symmetric  -- Sensation: intact and symmetric   Laboratory studies:  CBC Latest Ref Rng & Units 11/02/2017 10/24/2017  WBC 4.0 - 10.5 K/uL 8.8 8.3  Hemoglobin 13.0 - 17.0 g/dL 16.1 09.6  Hematocrit 04.5 - 52.0 % 40.1 41.2  Platelets 150 - 400 K/uL 226 258   CMP Latest Ref Rng & Units 11/02/2017 10/24/2017  Glucose 70 - 99 mg/dL 409(W) 90  BUN 8 - 23 mg/dL 20 16  Creatinine 1.19 - 1.24 mg/dL 1.47 8.29  Sodium 562 - 145 mmol/L 138 140  Potassium 3.5 - 5.1 mmol/L 4.3 4.3  Chloride 98 - 111 mmol/L 104 100  CO2 22 - 32 mmol/L 24 24  Calcium 8.9 - 10.3 mg/dL 1.3(Y) 9.2  Total Protein 6.5 - 8.1 g/dL 7.0 6.5  Total Bilirubin 0.3 - 1.2 mg/dL 0.7 0.7  Alkaline Phos 38 - 126 U/L 74 78  AST 15 - 41 U/L 23 17  ALT 0 - 44 U/L 19 9    Imaging:  CT Abdomen and Pelvis without  Contrast (11/02/2017) - personally reviewed and interpreted  1. Mild inflammatory changes within the proximal small bowel and small bowel mesentery compatible with nonspecific enteritis. There is no mass lesion or obstruction. 2. No significant ventral hernia is present. There is no significant inguinal hernia visualized. No significant free fluid is present. 3.  Aortic Atherosclerosis (ICD10-I70.0). 4. Scoliosis.  Central canal stenosis is greatest at L4-5.  Assessment:  67 y.o. yo Male with a problem list including...  Patient Active Problem List   Diagnosis Date Noted  .  Affective bipolar disorder (HCC) 10/17/2017  . Arthritis of knee 10/17/2017  . Nightmares associated with chronic post-traumatic stress disorder 10/17/2017  . Neck pain 04/12/2015  . Closed T1 spinal fracture (HCC) 07/31/2014  . Traumatic compression fracture of T3 vertebra (HCC) 07/31/2014    presents to clinic for follow-up twice undetectable, but reportedly ED-reduced, Left inguinal hernia, complicated by comorbidities including PAD with claudication, chronic lower back pain, osteoarthritis, generalized anxiety disorder, PTSD, bipolar disorder, and chronic ongoing tobacco abuse (smoking).  - discussed with patient signs and symptoms of hernia incarceration and obstruction - strategies for manual self-reduction of patient's hernia were also reviewed and discussed             - maintain hydration with high fiber heart healthy diet to reduce/minimize constipation +/- daily stool softener as needed - agreed to request risk stratification and medical optimization, but also requested re-evaluation in office to reassess for hernia prior to offering repair with the understanding that what can't be appreciated on exam likely shouldn't be repaired - return to clinicfollowing medical risk stratification and optimization, instructed to call if any questions or concerns             - smoking cessation encouraged and discussed  All of the above recommendations were discussed with the patient, and all of patient's questions were answered to his expressed satisfaction.  -- Scherrie GerlachJason E. Earlene Plateravis, MD, RPVI [place]: [facility] - Partnering for exceptional care. Office: [phone]

## 2017-11-07 NOTE — Patient Instructions (Signed)
We will send Medical Clearance to Dr.Masoud. Once we obtain this we will call you to schedule your Right Inguinal Hernia repair surgery.

## 2017-11-08 ENCOUNTER — Telehealth: Payer: Self-pay

## 2017-11-08 NOTE — Telephone Encounter (Signed)
Tried faxing Medical clearance to Dr.Mousd office. Unsuccessful.  I called patient to see if he could call Dr.Office and call me back with a fax number.  I also tried multiple times leaving message on Dr.Mosoud's receptionist line and no one has called back.

## 2017-11-14 ENCOUNTER — Ambulatory Visit: Payer: Medicare HMO | Admitting: Psychiatry

## 2017-11-14 ENCOUNTER — Encounter: Payer: Self-pay | Admitting: Licensed Clinical Social Worker

## 2017-11-14 ENCOUNTER — Ambulatory Visit (INDEPENDENT_AMBULATORY_CARE_PROVIDER_SITE_OTHER): Payer: Medicare HMO | Admitting: Licensed Clinical Social Worker

## 2017-11-14 DIAGNOSIS — F3160 Bipolar disorder, current episode mixed, unspecified: Secondary | ICD-10-CM

## 2017-11-14 NOTE — Progress Notes (Signed)
   THERAPIST PROGRESS NOTE  Session Time: [id number]  Participation Level: Active  Behavioral Response: CasualAlertEuphoric  Type of Therapy: Individual Therapy  Treatment Goals addressed: Coping  Interventions: DBT  Summary: Hector George is a 67 y.o. male who presents with with symptoms related to his Bipolar diagnosis. Hector George reports he has started journaling, as suggested at our last session, and reports it has helped with his insight to his symptoms, and how frequently his mood changes. Hector George reports he is still having difficulty sleeping, and reports he is sleeping roughly 3 hours per night. Hector George reports his mania causes conflict in his marriage, but states it is not "that bad." Hector George reports being adherent with his psych medications and stated he has felt more "normal," the last two days--though he recognized it takes 4-6 weeks to fully see a difference in his behavior.   Suicidal/Homicidal: Negativewithout intent/plan  Therapist Response: I provided Hector George with mindfulness and grounding activities to utilize when he is feeling "out of control," as he put it. Hector George reports he will also continue to journal, and attempt to recognize when his mood changes.   Plan: Return again in 2  weeks.  Diagnosis: Axis I: Bipolar, mixed    Axis II: No diagnosis    Hector DachKelsey Aizen Duval, LCSW 11/14/2017

## 2017-11-20 ENCOUNTER — Telehealth: Payer: Self-pay | Admitting: Surgery

## 2017-11-20 NOTE — Telephone Encounter (Signed)
I called Dr Fredna Dow office & l/m for Morrie Sheldon his nurse to call back and ask for [facility], or Elon Jester. We need to know if the patient has received medical clearance for Dr Earlene Plater to have surgery. I also ask her to fax the information .

## 2017-11-22 ENCOUNTER — Telehealth: Payer: Self-pay

## 2017-11-22 NOTE — Telephone Encounter (Signed)
Medical Clearance obtained from [facility].  I called patient to let him know we received his Clearance and that I could move forward with scheduling an appointment for a follow up.  Patient stated he was going to the [place] and that he did not appreciate the way he was treated and talked to  by Dr.Davis and that he would not be back.

## 2017-11-27 ENCOUNTER — Other Ambulatory Visit: Payer: Self-pay | Admitting: Psychiatry

## 2017-11-28 ENCOUNTER — Ambulatory Visit (INDEPENDENT_AMBULATORY_CARE_PROVIDER_SITE_OTHER): Payer: Medicare HMO | Admitting: Licensed Clinical Social Worker

## 2017-11-28 ENCOUNTER — Encounter: Payer: Self-pay | Admitting: Licensed Clinical Social Worker

## 2017-11-28 DIAGNOSIS — F3160 Bipolar disorder, current episode mixed, unspecified: Secondary | ICD-10-CM | POA: Diagnosis not present

## 2017-11-28 NOTE — Progress Notes (Signed)
   THERAPIST PROGRESS NOTE  Session Time: 0900-0930  Participation Level: Active  Behavioral Response: CasualAlertIrritable  Type of Therapy: Individual Therapy  Treatment Goals addressed: Coping  Interventions: Supportive  Summary: Hector George is a 67 y.o. male who presents with mood symptoms related to his Bipolar Diagnosis. Hector George reports feeling more "leveled out," since being on his medications. He reports his sleep is still disruptive, and he usually gets 2-3 hours per night. We discussed ways to improve sleep, including not drinking caffeine after 1PM. Hector George was in agreement with this idea, and reported he planned to get some "sleepy time tea," as well. Hector George reported conflict in his marriage, and stated he is feeling irritable lately. We discussed ways to improve his communication with his wife, including utilizing I Statements and assertive communication. We discussed ways to avoid the argument escalating, and ways to effectively stay in the moment with his wife. Hector George was in agreement with this idea, and reports he felt much better after processing their communication problems.   Suicidal/Homicidal: No  Therapist Response: Hector George continues to be willing to improve his mental health in any way he is instructed to do so. Hector George speaks openly and honestly about his mood, relationship, and marijuana use. Hector George is able to make connections between his behaviors and his moods, and is able to understand what he is able to do to improve the behaviors.   Plan: Return again in 2 weeks.  Diagnosis: Axis I: Bipolar I disorder, most recent episode mixed    Axis II: No diagnosis    Heidi Dach, LCSW 11/28/2017

## 2017-12-03 ENCOUNTER — Ambulatory Visit: Payer: Medicare HMO | Admitting: Psychiatry

## 2017-12-13 ENCOUNTER — Telehealth: Payer: Self-pay

## 2017-12-13 NOTE — Telephone Encounter (Signed)
Patient called on 12-12-17 saying that he needs a refill on  Prozac, but on 10-24-17 patient received 30 tabs with 3 refills. I informed patient that he will have to call the pharmacy and request that medication be refilled.

## 2017-12-20 ENCOUNTER — Other Ambulatory Visit: Payer: Self-pay | Admitting: Psychiatry

## 2017-12-24 ENCOUNTER — Ambulatory Visit (INDEPENDENT_AMBULATORY_CARE_PROVIDER_SITE_OTHER): Payer: Medicare HMO | Admitting: Psychiatry

## 2017-12-24 ENCOUNTER — Encounter: Payer: Self-pay | Admitting: Psychiatry

## 2017-12-24 ENCOUNTER — Telehealth (HOSPITAL_COMMUNITY): Payer: Self-pay | Admitting: Psychiatry

## 2017-12-24 ENCOUNTER — Other Ambulatory Visit: Payer: Self-pay

## 2017-12-24 VITALS — BP 138/77 | HR 91 | Temp 97.2°F | Wt 128.4 lb

## 2017-12-24 DIAGNOSIS — F1021 Alcohol dependence, in remission: Secondary | ICD-10-CM | POA: Diagnosis not present

## 2017-12-24 DIAGNOSIS — F17200 Nicotine dependence, unspecified, uncomplicated: Secondary | ICD-10-CM | POA: Diagnosis not present

## 2017-12-24 DIAGNOSIS — F5105 Insomnia due to other mental disorder: Secondary | ICD-10-CM

## 2017-12-24 DIAGNOSIS — F4310 Post-traumatic stress disorder, unspecified: Secondary | ICD-10-CM

## 2017-12-24 DIAGNOSIS — F3160 Bipolar disorder, current episode mixed, unspecified: Secondary | ICD-10-CM

## 2017-12-24 DIAGNOSIS — F1220 Cannabis dependence, uncomplicated: Secondary | ICD-10-CM

## 2017-12-24 MED ORDER — OLANZAPINE 10 MG PO TABS: 15.0000 mg | ORAL_TABLET | Freq: Every day | ORAL | 1 refills | Status: DC

## 2017-12-24 MED ORDER — DIVALPROEX SODIUM 500 MG PO DR TAB: 500.0000 mg | DELAYED_RELEASE_TABLET | Freq: Two times a day (BID) | ORAL | 1 refills | Status: DC

## 2017-12-24 MED ORDER — PRAZOSIN HCL 1 MG PO CAPS: 1.0000 mg | ORAL_CAPSULE | Freq: Every day | ORAL | 0 refills | Status: DC

## 2017-12-24 NOTE — Progress Notes (Signed)
BH MD OP Progress Note  12/24/2017 2:35 PM Hector George  MRN:  [id number]  Chief Complaint: ' I am here for follow up.' Chief Complaint    Follow-up     HPI: Hector George is a 67 year old Caucasian male, employed, married, lives in [place], has a history of bipolar disorder, PTSD, insomnia, alcohol use disorder in remission, tobacco use disorder, chronic pain, inguinal hernia, presented to the clinic today for a follow-up visit.  Pt today reports he continues to struggle with mood symptoms.  He reports he has been struggling with anger issues, irritability, racing thoughts and so on.  He reports he was so angry he had trouble coping with it and decided to come and get help.  He continues to be compliant with his medications as prescribed.  He is currently on Depakote 3 times a day.  He however reports he could not get the Depakote level done as discussed last visit.  He denies any side effects to the medications.  Discussed with patient that his Depakote can be increased today however he needs to get labs done in 5 days from now.  He agrees with plan.  Patient continues to struggle with sleep problems.  He reports he struggles with a lot of nightmares which affects his sleep.  Discussed prazosin.  He agrees with plan.  He continues to use cannabis on a regular basis.  Discussed with patient the effect of cannabis on his mood symptoms.  Provided counseling.  He agrees to cut down.  He continues to be sober from alcohol.  Last use of alcohol was several years ago.  Denies any suicidality.  He denies any perceptual disturbances on may be being slightly paranoid.  He reports he is able to cope with it.  He denies any homicidality.  He is currently in psychotherapy with Ms. Hector George our therapist.  He has upcoming appointment scheduled.   Visit Diagnosis:    ICD-10-CM   1. Bipolar I disorder, most recent episode mixed (HCC) F31.60 divalproex (DEPAKOTE) 500 MG DR tablet    OLANZapine (ZYPREXA) 10 MG tablet     prazosin (MINIPRESS) 1 MG capsule  2. PTSD (post-traumatic stress disorder) F43.10 OLANZapine (ZYPREXA) 10 MG tablet    prazosin (MINIPRESS) 1 MG capsule  3. Insomnia due to mental condition F51.05 OLANZapine (ZYPREXA) 10 MG tablet    prazosin (MINIPRESS) 1 MG capsule  4. Cannabis use disorder, moderate, dependence (HCC) F12.20   5. Alcohol use disorder, severe, in sustained remission (HCC) F10.21 OLANZapine (ZYPREXA) 10 MG tablet  6. Tobacco use disorder F17.200     Past Psychiatric History: Have reviewed past psychiatric history from my progress note on 10/24/2017.  Past Medical History:  Past Medical History:  Diagnosis Date  . Affective bipolar disorder (HCC)   . Anxiety   . Claudication, class I (HCC)   . Depression   . Left inguinal hernia   . Lumbago   . Nightmares associated with chronic post-traumatic stress disorder   . Osteoarthritis     Past Surgical History:  Procedure Laterality Date  . BACK SURGERY    . CERVICAL SPINE SURGERY    . NOSE SURGERY      Family Psychiatric History: Have reviewed family psychiatric history from my progress note on 10/24/2017.  Family History:  Family History  Problem Relation Age of Onset  . Heart disease Mother   . Anxiety disorder Brother   . Depression Brother   . Anxiety disorder Sister   . Depression Sister   .  Cancer Neg Hx     Social History: Have reviewed social history from my progress note on 10/24/2017. Social History   Socioeconomic History  . Marital status: Married    Spouse name: ruth  . Number of children: 1  . Years of education: Not on file  . Highest education level: Some college, no degree  Occupational History  . Not on file  Social Needs  . Financial resource strain: Hard  . Food insecurity:    Worry: Sometimes true    Inability: Sometimes true  . Transportation needs:    Medical: Yes    Non-medical: Yes  Tobacco Use  . Smoking status: Current Every Day Smoker    Packs/day: 1.00     Types: Cigarettes  . Smokeless tobacco: Never Used  Substance and Sexual Activity  . Alcohol use: No  . Drug use: Yes    Types: Marijuana    Comment: daily  . Sexual activity: Yes  Lifestyle  . Physical activity:    Days per week: 0 days    Minutes per session: 0 min  . Stress: Very much  Relationships  . Social connections:    Talks on phone: Not on file    Gets together: Not on file    Attends religious service: Never    Active member of club or organization: No    Attends meetings of clubs or organizations: Never    Relationship status: Married  Other Topics Concern  . Not on file  Social History Narrative  . Not on file    Allergies: No Known Allergies  Metabolic Disorder Labs: Lab Results  Component Value Date   HGBA1C 5.6 10/24/2017   Lab Results  Component Value Date   PROLACTIN 5.6 10/24/2017   Lab Results  Component Value Date   CHOL 172 10/24/2017   TRIG 75 10/24/2017   HDL 54 10/24/2017   LDLCALC 103 (H) 10/24/2017   Lab Results  Component Value Date   TSH 0.951 10/24/2017    Therapeutic Level Labs: No results found for: LITHIUM No results found for: VALPROATE No components found for:  CBMZ  Current Medications: Current Outpatient Medications  Medication Sig Dispense Refill  . divalproex (DEPAKOTE) 500 MG DR tablet Take 1 tablet (500 mg total) by mouth 2 (two) times daily. 60 tablet 1  . FLUoxetine (PROZAC) 40 MG capsule Take 1 capsule (40 mg total) by mouth daily. Pt has supplies 30 capsule 3  . OLANZapine (ZYPREXA) 10 MG tablet Take 1.5 tablets (15 mg total) by mouth at bedtime. 45 tablet 1  . prazosin (MINIPRESS) 1 MG capsule Take 1 capsule (1 mg total) by mouth at bedtime. For nightmares 30 capsule 0   No current facility-administered medications for this visit.      Musculoskeletal: Strength & Muscle Tone: within normal limits Gait & Station: normal Patient leans: N/A  Psychiatric Specialty Exam: Review of Systems   Psychiatric/Behavioral: Positive for substance abuse. The patient is nervous/anxious and has insomnia.   All other systems reviewed and are negative.   Blood pressure 138/77, pulse 91, temperature (!) 97.2 F (36.2 C), temperature source Oral, weight 128 lb 6.4 oz (58.2 kg).Body mass index is 19.52 kg/m.  General Appearance: Casual  Eye Contact:  Fair  Speech:  Normal Rate  Volume:  Normal  Mood:  Anxious  Affect:  Congruent  Thought Process:  Goal Directed and Descriptions of Associations: Intact  Orientation:  Full (Time, Place, and Person)  Thought Content: Logical  Suicidal Thoughts:  No  Homicidal Thoughts:  No  Memory:  Immediate;   Fair Recent;   Fair Remote;   Fair  Judgement:  Fair  Insight:  Fair  Psychomotor Activity:  Normal  Concentration:  Concentration: Fair and Attention Span: Fair  Recall:  [employer] of Knowledge: Fair  Language: Fair  Akathisia:  No  Handed:  Right  AIMS (if indicated): denies tremors, rigidity,stiffness  Assets:  Communication Skills Desire for Improvement Social Support  ADL's:  Intact  Cognition: WNL  Sleep:  Poor   Screenings:   Assessment and Plan: Adem is a 67 year old Caucasian male, married, employed, lives in [place], has a history of bipolar disorder, PTSD, chronic pain, inguinal hernia, cannabis use disorder, tobacco use disorder, alcohol use disorder in remission, presented to the clinic today for a follow-up visit.  Patient is biologically predisposed given his family history of mental health problems.  Patient also has a history of trauma as well as history of substance abuse problem.  Patient continues to have mood lability as well as sleep issues.  Will benefit from continued medication changes as well as psychotherapy sessions.  Also discussed referral for IOP program he agrees with plan.  Plan For bipolar disorder Increase Zyprexa to 15 mg p.o. nightly Continue Prozac 40 mg p.o. Daily. Change Depakote to 500 mg  p.o. twice daily. Provided lab slip again to get Depakote levels done.  He reports she will get it done on Monday.  For PTSD Zyprexa as prescribed Prozac 40 mg p.o. daily Add prazosin 1 mg p.o. nightly for nightmares. Continue psychotherapy with Ms. Craig  Insomnia And prazosin 1 mg p.o. nightly. Zyprexa will also help with sleep.  For alcohol use disorder in remission Continues to stay sober.  Cannabis use disorder Provided substance abuse counseling.  For tobacco use disorder Provided smoking cessation counseling.  Will refer patient to IOP program.  Patient to continue psychotherapy sessions.  Follow-up in clinic in 1 week.  More than 50 % of the time was spent for psychoeducation and supportive psychotherapy and care coordination.  This note was generated in part or whole with voice recognition software. Voice recognition is usually quite accurate but there are transcription errors that can and very often do occur. I apologize for any typographical errors that were not detected and corrected.            Jomarie Longs, MD 12/25/2017, 8:45 AM

## 2017-12-24 NOTE — Telephone Encounter (Signed)
D:  Dr. Elna Breslow referred Hector George to [facility].  A:  Placed call to Hector George in order to orient him and give him a start date.  According to Hector George; he is very busy at this time, but requested that Clinical research associate mail information to him.  Will put information packet into the mail for Hector George today.  Informed Dr. Elna Breslow.  R:  Hector George receptive.

## 2017-12-24 NOTE — Patient Instructions (Signed)
Please get your Depakote level done.

## 2017-12-25 ENCOUNTER — Ambulatory Visit: Payer: Medicare HMO | Admitting: Licensed Clinical Social Worker

## 2017-12-25 ENCOUNTER — Encounter: Payer: Self-pay | Admitting: Psychiatry

## 2017-12-30 ENCOUNTER — Other Ambulatory Visit: Payer: Self-pay

## 2017-12-30 ENCOUNTER — Emergency Department
Admission: EM | Admit: 2017-12-30 | Discharge: 2017-12-30 | Disposition: A | Discharge status: Home / Self Care | Payer: Medicare HMO | Attending: Emergency Medicine | Admitting: Emergency Medicine

## 2017-12-30 DIAGNOSIS — K4090 Unilateral inguinal hernia, without obstruction or gangrene, not specified as recurrent: Secondary | ICD-10-CM | POA: Insufficient documentation

## 2017-12-30 DIAGNOSIS — R1032 Left lower quadrant pain: Secondary | ICD-10-CM | POA: Diagnosis not present

## 2017-12-30 DIAGNOSIS — F17210 Nicotine dependence, cigarettes, uncomplicated: Secondary | ICD-10-CM | POA: Insufficient documentation

## 2017-12-30 MED ORDER — TRAMADOL HCL 50 MG PO TABS: 50.0000 mg | ORAL_TABLET | Freq: Four times a day (QID) | ORAL | 0 refills | Status: AC | PRN

## 2017-12-30 MED ORDER — DOCUSATE SODIUM 100 MG PO CAPS: 100.0000 mg | ORAL_CAPSULE | Freq: Two times a day (BID) | ORAL | 2 refills | Status: AC

## 2017-12-30 MED ORDER — DOCUSATE SODIUM 100 MG PO CAPS
100.0000 mg | ORAL_CAPSULE | Freq: Once | ORAL | Status: AC
  Administered 2017-12-30: 100 mg via ORAL
  Filled 2017-12-30: qty 1

## 2017-12-30 MED ORDER — OXYCODONE-ACETAMINOPHEN 5-325 MG PO TABS
2.0000 | ORAL_TABLET | Freq: Once | ORAL | Status: AC
  Administered 2017-12-30: 2 via ORAL
  Filled 2017-12-30: qty 2

## 2017-12-30 NOTE — ED Triage Notes (Signed)
Left sided inguinal hernia since April, pain is progressive. Pt has not seen surgeon for hernia since Dr. Earlene Plater a few months ago. approx size of golf ball, no discoloration. Pt alert and oriented X4, active, cooperative, pt in NAD. RR even and unlabored, color WNL.

## 2017-12-30 NOTE — ED Notes (Signed)
AAOx3.  Skin warm and dry.  NAD 

## 2017-12-30 NOTE — ED Provider Notes (Signed)
[facility] Emergency Department Provider Note       Time seen: ----------------------------------------- 2:07 PM on 12/30/2017 -----------------------------------------   I have reviewed the triage vital signs and the nursing notes.  HISTORY   Chief Complaint Hernia    HPI Hector George is a 67 y.o. male with a history of bipolar disorder, depression, inguinal hernia who presents to the ED for left inguinal hernia pain.  Patient states the pain has been progressive.  Recently has been doing some heavy lifting which he thinks is aggravated hernia.  He was seen by general surgery several months ago but did not continue with having the hernia repaired.  Past Medical History:  Diagnosis Date  . Affective bipolar disorder (HCC)   . Anxiety   . Claudication, class I (HCC)   . Depression   . Left inguinal hernia   . Lumbago   . Nightmares associated with chronic post-traumatic stress disorder   . Osteoarthritis     Patient Active Problem List   Diagnosis Date Noted  . Affective bipolar disorder (HCC) 10/17/2017  . Arthritis of knee 10/17/2017  . Nightmares associated with chronic post-traumatic stress disorder 10/17/2017  . Neck pain 04/12/2015  . Closed T1 spinal fracture (HCC) 07/31/2014  . Traumatic compression fracture of T3 vertebra (HCC) 07/31/2014    Past Surgical History:  Procedure Laterality Date  . BACK SURGERY    . CERVICAL SPINE SURGERY    . NOSE SURGERY      Allergies Patient has no known allergies.  Social History Social History   Tobacco Use  . Smoking status: Current Every Day Smoker    Packs/day: 1.00    Types: Cigarettes  . Smokeless tobacco: Never Used  Substance Use Topics  . Alcohol use: No  . Drug use: Yes    Types: Marijuana    Comment: daily   Review of Systems Constitutional: Negative for fever. Cardiovascular: Negative for chest pain. Respiratory: Negative for shortness of breath. Gastrointestinal:  Positive for inguinal pain Musculoskeletal: Negative for back pain. Skin: Negative for rash. Neurological: Negative for headaches, focal weakness or numbness.  All systems negative/normal/unremarkable except as stated in the HPI  ____________________________________________   PHYSICAL EXAM:  VITAL SIGNS: ED Triage Vitals [12/30/17 1220]  Enc Vitals Group     BP 135/87     Pulse Rate 72     Resp 14     Temp 97.6 F (36.4 C)     Temp Source Oral     SpO2 96 %     Weight 128 lb (58.1 kg)     Height 5\' 8"  (1.727 m)     Head Circumference      Peak Flow      Pain Score 10     Pain Loc      Pain Edu?      Excl. in GC?    Constitutional: Alert and oriented. Well appearing and in no distress. Cardiovascular: Normal rate, regular rhythm. No murmurs, rubs, or gallops. Respiratory: Normal respiratory effort without tachypnea nor retractions. Breath sounds are clear and equal bilaterally. No wheezes/rales/rhonchi. Gastrointestinal: Soft and nontender. Normal bowel sounds Genitourinary: Reducible left inguinal hernia Musculoskeletal: Nontender with normal range of motion in extremities. No lower extremity tenderness nor edema. Neurologic:  Normal speech and language. No gross focal neurologic deficits are appreciated.  Skin:  Skin is warm, dry and intact. No rash noted. ____________________________________________  ED COURSE:  As part of my medical decision making, I reviewed the following  data within the electronic MEDICAL RECORD NUMBER History obtained from family if available, nursing notes, old chart and ekg, as well as notes from prior ED visits. Patient presented for inguinal hernia, hernia is reducible and needs outpatient follow-up.  ____________________________________________    ____________________________________________  DIFFERENTIAL DIAGNOSIS   Inguinal hernia, muscle strain  FINAL ASSESSMENT AND PLAN  Left inguinal hernia   Plan: The patient had presented for  left inguinal hernia.  Patient is in no distress and the hernia is easily reducible.  He will be referred to general surgery for outpatient follow-up.   Ulice Dash, MD   Note: This note was generated in part or whole with voice recognition software. Voice recognition is usually quite accurate but there are transcription errors that can and very often do occur. I apologize for any typographical errors that were not detected and corrected.     Emily Filbert, MD 12/30/17 1409

## 2018-01-03 ENCOUNTER — Ambulatory Visit: Payer: Medicare HMO | Admitting: Psychiatry

## 2018-01-13 DIAGNOSIS — R238 Other skin changes: Secondary | ICD-10-CM | POA: Diagnosis not present

## 2018-01-13 DIAGNOSIS — K4090 Unilateral inguinal hernia, without obstruction or gangrene, not specified as recurrent: Secondary | ICD-10-CM | POA: Diagnosis not present

## 2018-01-14 ENCOUNTER — Other Ambulatory Visit: Payer: Self-pay | Admitting: Psychiatry

## 2018-01-14 DIAGNOSIS — F3160 Bipolar disorder, current episode mixed, unspecified: Secondary | ICD-10-CM | POA: Diagnosis not present

## 2018-01-15 ENCOUNTER — Other Ambulatory Visit: Payer: Self-pay | Admitting: Psychiatry

## 2018-01-15 ENCOUNTER — Ambulatory Visit: Payer: Self-pay | Admitting: Surgery

## 2018-01-15 LAB — VALPROIC ACID LEVEL: VALPROIC ACID LVL: 22 ug/mL — AB (ref 50–100)

## 2018-01-15 NOTE — H&P (View-Only) (Signed)
Subjective:   CC: inguinal hernia  HPI:  Hector George is a 67 y.o. male who is here for left inguinal hernia repair.  Symptoms were first noted 7 months ago. Pain is intermittent and discomfort, confined to the left groin, without radiation.  Associated with nothing specific, exacerbated by lifting and increased activity  Lump is reducible. Patient has no symptoms of  chronic constipation.    Past Medical History:  has a past medical history of Alcohol use, Bipolar disorder (CMS-HCC), Depression, GERD (gastroesophageal reflux disease), and Hypertension.  Past Surgical History:       Past Surgical History:  Procedure Laterality Date  . BACK SURGERY    . FRACTURE SURGERY Left    ARM  . POSTERIOR CERVICLE SPINE FUSION MULTIPLE LEVELS N/A 07/31/2014   Procedure: POSTERIOR CERVICAL SPINE FUSION MULTIPLE LEVELS;  Surgeon: Ronalee Beltsobert Eric Isaacs, MD;  Location: [facility];  Service: Neurosurgery;  Laterality: N/A;    Family History: family history includes Myocardial Infarction (Heart attack) in his father and mother.  Social History:  reports that he has been smoking cigarettes. He has been smoking about 1.00 pack per day. He has never used smokeless tobacco. He reports current alcohol use. He reports that he does not use drugs.  Current Medications: has a current medication list which includes the following prescription(s): aspirin-caffeine, divalproex, fluoxetine, olanzapine, and prazosin.  Allergies:     Allergies as of 01/13/2018  . (No Known Allergies)    ROS:  A 5 point review of systems was performed and pertinent positives and negatives noted in HPI   Objective:   BP 127/80   Pulse 77   Temp 36 C (96.8 F) (Oral)   Ht 172.7 cm (5\' 8" )   Wt 58.1 kg (128 lb)   BMI 19.46 kg/m   Constitutional :  alert, appears stated age, cooperative and no distress  Lymphatics/Throat:  no asymmetry, masses, or scars  Respiratory:  clear to auscultation  bilaterally  Cardiovascular:  regular rate and rhythm  Gastrointestinal: soft, non-tender; bowel sounds normal; no masses,  no organomegaly. inguinal hernia noted on left small  Musculoskeletal: Steady gait and movement  Skin: Cool and moist, no visible surgical scars   Psychiatric: Normal affect, non-agitated, not confused       LABS:  n/a   RADS: n/a Assessment:       Non-recurrent unilateral inguinal hernia without obstruction or gangrene [K40.90]  Plan:   1. Non-recurrent unilateral inguinal hernia without obstruction or gangrene [K40.90]   Discussed the risk of surgery including recurrence, which can be up to 50% in the case of incisional or complex hernias, possible use of prosthetic materials (mesh) and the increased risk of mesh infxn if used, bleeding, chronic pain, post-op infxn, post-op SBO or ileus, and possible re-operation to address said risks. The risks of general anesthetic, if used, includes MI, CVA, sudden death or even reaction to anesthetic medications also discussed. Alternatives include continued observation.  Benefits include possible symptom relief, prevention of incarceration, strangulation, enlargement in size over time, and the risk of emergency surgery in the face of strangulation.   Typical post-op recovery time of 3-5 days with 4-6 weeks of activity restrictions were also discussed.  The patient verbalized understanding and all questions were answered to the patient's satisfaction.   2. Will proceed with surgical treatment. Pending UDS     Electronically signed by Sung AmabileSakai, Hector Karner, DO on 01/13/2018 10:56 AM

## 2018-01-15 NOTE — H&P (Addendum)
Subjective:   CC: inguinal hernia  HPI:  Hector George is a 67 y.o. male who is here for left inguinal hernia repair.  Symptoms were first noted 7 months ago. Pain is intermittent and discomfort, confined to the left groin, without radiation.  Associated with nothing specific, exacerbated by lifting and increased activity  Lump is reducible. Patient has no symptoms of  chronic constipation.    Past Medical History:  has a past medical history of Alcohol use, Bipolar disorder (CMS-HCC), Depression, GERD (gastroesophageal reflux disease), and Hypertension.  Past Surgical History:       Past Surgical History:  Procedure Laterality Date  . BACK SURGERY    . FRACTURE SURGERY Left    ARM  . POSTERIOR CERVICLE SPINE FUSION MULTIPLE LEVELS N/A 07/31/2014   Procedure: POSTERIOR CERVICAL SPINE FUSION MULTIPLE LEVELS;  Surgeon: Robert Eric Isaacs, MD;  Location: [facility];  Service: Neurosurgery;  Laterality: N/A;    Family History: family history includes Myocardial Infarction (Heart attack) in his father and mother.  Social History:  reports that he has been smoking cigarettes. He has been smoking about 1.00 pack per day. He has never used smokeless tobacco. He reports current alcohol use. He reports that he does not use drugs.  Current Medications: has a current medication list which includes the following prescription(s): aspirin-caffeine, divalproex, fluoxetine, olanzapine, and prazosin.  Allergies:     Allergies as of 01/13/2018  . (No Known Allergies)    ROS:  A 5 point review of systems was performed and pertinent positives and negatives noted in HPI   Objective:   BP 127/80   Pulse 77   Temp 36 C (96.8 F) (Oral)   Ht 172.7 cm (5' 8")   Wt 58.1 kg (128 lb)   BMI 19.46 kg/m   Constitutional :  alert, appears stated age, cooperative and no distress  Lymphatics/Throat:  no asymmetry, masses, or scars  Respiratory:  clear to auscultation  bilaterally  Cardiovascular:  regular rate and rhythm  Gastrointestinal: soft, non-tender; bowel sounds normal; no masses,  no organomegaly. inguinal hernia noted on left small  Musculoskeletal: Steady gait and movement  Skin: Cool and moist, no visible surgical scars   Psychiatric: Normal affect, non-agitated, not confused       LABS:  n/a   RADS: n/a Assessment:       Non-recurrent unilateral inguinal hernia without obstruction or gangrene [K40.90]  Plan:   1. Non-recurrent unilateral inguinal hernia without obstruction or gangrene [K40.90]   Discussed the risk of surgery including recurrence, which can be up to 50% in the case of incisional or complex hernias, possible use of prosthetic materials (mesh) and the increased risk of mesh infxn if used, bleeding, chronic pain, post-op infxn, post-op SBO or ileus, and possible re-operation to address said risks. The risks of general anesthetic, if used, includes MI, CVA, sudden death or even reaction to anesthetic medications also discussed. Alternatives include continued observation.  Benefits include possible symptom relief, prevention of incarceration, strangulation, enlargement in size over time, and the risk of emergency surgery in the face of strangulation.   Typical post-op recovery time of 3-5 days with 4-6 weeks of activity restrictions were also discussed.  The patient verbalized understanding and all questions were answered to the patient's satisfaction.   2. Will proceed with surgical treatment. Pending UDS     Electronically signed by Saleema Weppler, DO on 01/13/2018 10:56 AM      

## 2018-01-16 ENCOUNTER — Telehealth: Payer: Self-pay | Admitting: Psychiatry

## 2018-01-16 DIAGNOSIS — F3160 Bipolar disorder, current episode mixed, unspecified: Secondary | ICD-10-CM

## 2018-01-16 MED ORDER — DIVALPROEX SODIUM 500 MG PO DR TAB: 500.0000 mg | DELAYED_RELEASE_TABLET | Freq: Three times a day (TID) | ORAL | 1 refills | Status: DC

## 2018-01-16 NOTE — Telephone Encounter (Signed)
Depakote level is subtherapeutic at 22 - 01/15/2018  Called patient to discuss . Will increase Depakote dosage.

## 2018-01-17 ENCOUNTER — Ambulatory Visit (INDEPENDENT_AMBULATORY_CARE_PROVIDER_SITE_OTHER): Payer: Medicare HMO | Admitting: Psychiatry

## 2018-01-17 ENCOUNTER — Telehealth: Payer: Self-pay

## 2018-01-17 ENCOUNTER — Encounter: Payer: Self-pay | Admitting: Psychiatry

## 2018-01-17 ENCOUNTER — Other Ambulatory Visit: Payer: Self-pay

## 2018-01-17 VITALS — BP 128/81 | HR 76 | Wt 132.8 lb

## 2018-01-17 DIAGNOSIS — F5105 Insomnia due to other mental disorder: Secondary | ICD-10-CM

## 2018-01-17 DIAGNOSIS — F1021 Alcohol dependence, in remission: Secondary | ICD-10-CM

## 2018-01-17 DIAGNOSIS — F3160 Bipolar disorder, current episode mixed, unspecified: Secondary | ICD-10-CM | POA: Diagnosis not present

## 2018-01-17 DIAGNOSIS — F4310 Post-traumatic stress disorder, unspecified: Secondary | ICD-10-CM

## 2018-01-17 DIAGNOSIS — F1220 Cannabis dependence, uncomplicated: Secondary | ICD-10-CM | POA: Diagnosis not present

## 2018-01-17 MED ORDER — OLANZAPINE 15 MG PO TABS: 15.0000 mg | ORAL_TABLET | Freq: Every day | ORAL | 2 refills | Status: DC

## 2018-01-17 MED ORDER — OLANZAPINE 10 MG PO TABS: 15.0000 mg | ORAL_TABLET | Freq: Every day | ORAL | 1 refills | Status: DC

## 2018-01-17 MED ORDER — PRAZOSIN HCL 1 MG PO CAPS: 1.0000 mg | ORAL_CAPSULE | Freq: Every day | ORAL | 2 refills | Status: DC

## 2018-01-17 MED ORDER — TRAZODONE HCL 50 MG PO TABS: 50.0000 mg | ORAL_TABLET | Freq: Every day | ORAL | 1 refills | Status: DC

## 2018-01-17 NOTE — Progress Notes (Signed)
[facility] MD OP Progress Note  01/17/2018 12:33 PM Hector George  MRN:  [id number]  Chief Complaint: ' I am here for follow up." Chief Complaint    Follow-up; Medication Refill     HPI: Hector George is a 67 year old Caucasian male, employed, married, lives in [place], has a history of bipolar disorder, PTSD, insomnia, alcohol use disorder in remission, cannabis use disorder, tobacco use disorder, chronic pain, inguinal hernia, presented to the clinic today for a follow-up visit.  Patient today reports he was going through some stressors.  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  Patient reports he was very depressed for a few days since his wife was not doing well.  Patient reports there was a time when he felt that he did not want to live since his wife was not doing well at that time.  Patient however reports his wife has recovered from it and is going to go back to work in a couple of days.  He reports he feels much better now.  Patient reports he was taking two of the Zyprexa at night since he thought it was for his sleep.  Patient reports he would take one at bedtime and the other one when he wakes up in the middle of the night.  Discussed with patient that Zyprexa is not his sleep medication and that it was prescribed for his mood lability.  Discussed with him to follow the instructions and not take more than what is prescribed to him.  Also discussed adding a sleep medication like trazodone as needed to help him with his sleep.  Patient agrees with plan.  Discussed Depakote level with patient-resulted on 01/15/2018-subtherapeutic at 22.  Discussed with him that his Depakote dosage has been increased to 500 mg 3 times a day and the new prescription has been sent to the pharmacy.  Discussed with him that he will need another Depakote level done in a week after taking the new dose.  Provided him with lab slip to go to [facility].  Patient currently denies  any suicidality.  Patient denies any homicidality.  Patient denies any perceptual disturbances.  Patient reports he continues to cut down on his cannabis use.  Patient continues to be motivated to stay in psychotherapy.  He has upcoming appointment with Ms. Heidi Dach. Visit Diagnosis:    ICD-10-CM   1. Bipolar I disorder, most recent episode mixed (HCC) F31.60 prazosin (MINIPRESS) 1 MG capsule    traZODone (DESYREL) 50 MG tablet    DISCONTINUED: OLANZapine (ZYPREXA) 10 MG tablet  2. PTSD (post-traumatic stress disorder) F43.10 prazosin (MINIPRESS) 1 MG capsule    traZODone (DESYREL) 50 MG tablet    DISCONTINUED: OLANZapine (ZYPREXA) 10 MG tablet  3. Insomnia due to mental condition F51.05 prazosin (MINIPRESS) 1 MG capsule    traZODone (DESYREL) 50 MG tablet    DISCONTINUED: OLANZapine (ZYPREXA) 10 MG tablet  4. Cannabis use disorder, moderate, dependence (HCC) F12.20   5. Alcohol use disorder, severe, in sustained remission (HCC) F10.21 DISCONTINUED: OLANZapine (ZYPREXA) 10 MG tablet    Past Psychiatric History: I have reviewed past psychiatric history from my progress note on 10/24/2017  Past Medical History:  Past Medical History:  Diagnosis Date  . Affective bipolar disorder (HCC)   . Anxiety   . Claudication, class I (HCC)   . Depression   . Left inguinal hernia   . Lumbago   . Nightmares associated  with chronic post-traumatic stress disorder   . Osteoarthritis     Past Surgical History:  Procedure Laterality Date  . BACK SURGERY    . CERVICAL SPINE SURGERY    . NOSE SURGERY      Family Psychiatric History: Have reviewed family psychiatric history from my progress note on 10/24/2017  Family History:  Family History  Problem Relation Age of Onset  . Heart disease Mother   . Anxiety disorder Brother   . Depression Brother   . Anxiety disorder Sister   . Depression Sister   . Cancer Neg Hx     Social History: I have reviewed social history from my progress note  on 10/24/2017 Social History   Socioeconomic History  . Marital status: Married    Spouse name: ruth  . Number of children: 1  . Years of education: Not on file  . Highest education level: Some college, no degree  Occupational History  . Not on file  Social Needs  . Financial resource strain: Hard  . Food insecurity:    Worry: Sometimes true    Inability: Sometimes true  . Transportation needs:    Medical: Yes    Non-medical: Yes  Tobacco Use  . Smoking status: Current Every Day Smoker    Packs/day: 1.00    Types: Cigarettes  . Smokeless tobacco: Never Used  Substance and Sexual Activity  . Alcohol use: No  . Drug use: Yes    Types: Marijuana    Comment: daily  . Sexual activity: Yes  Lifestyle  . Physical activity:    Days per week: 0 days    Minutes per session: 0 min  . Stress: Very much  Relationships  . Social connections:    Talks on phone: Not on file    Gets together: Not on file    Attends religious service: Never    Active member of club or organization: No    Attends meetings of clubs or organizations: Never    Relationship status: Married  Other Topics Concern  . Not on file  Social History Narrative  . Not on file    Allergies: No Known Allergies  Metabolic Disorder Labs: Lab Results  Component Value Date   HGBA1C 5.6 10/24/2017   Lab Results  Component Value Date   PROLACTIN 5.6 10/24/2017   Lab Results  Component Value Date   CHOL 172 10/24/2017   TRIG 75 10/24/2017   HDL 54 10/24/2017   LDLCALC 103 (H) 10/24/2017   Lab Results  Component Value Date   TSH 0.951 10/24/2017    Therapeutic Level Labs: No results found for: LITHIUM No results found for: VALPROATE No components found for:  CBMZ  Current Medications: Current Outpatient Medications  Medication Sig Dispense Refill  . Aspirin-Caffeine 845-65 MG PACK Take by mouth.    . divalproex (DEPAKOTE) 500 MG DR tablet Take 1 tablet (500 mg total) by mouth 3 (three) times  daily. 90 tablet 1  . docusate sodium (COLACE) 100 MG capsule Take 1 capsule (100 mg total) by mouth 2 (two) times daily. 30 capsule 2  . FLUoxetine (PROZAC) 40 MG capsule Take 1 capsule (40 mg total) by mouth daily. Pt has supplies 30 capsule 3  . prazosin (MINIPRESS) 1 MG capsule Take 1 capsule (1 mg total) by mouth at bedtime. For nightmares 30 capsule 2  . traMADol (ULTRAM) 50 MG tablet Take 1 tablet (50 mg total) by mouth every 6 (six) hours as needed. 20 tablet 0  .  OLANZapine (ZYPREXA) 15 MG tablet Take 1 tablet (15 mg total) by mouth at bedtime. For mood and sleep 30 tablet 2  . traZODone (DESYREL) 50 MG tablet Take 1-2 tablets (50-100 mg total) by mouth at bedtime. For sleep 60 tablet 1   No current facility-administered medications for this visit.      Musculoskeletal: Strength & Muscle Tone: within normal limits Gait & Station: normal Patient leans: N/A  Psychiatric Specialty Exam: Review of Systems  Psychiatric/Behavioral: The patient has insomnia.   All other systems reviewed and are negative.   Blood pressure 128/81, pulse 76, weight 132 lb 12.8 oz (60.2 kg).Body mass index is 20.19 kg/m.  General Appearance: Casual  Eye Contact:  Fair  Speech:  Normal Rate  Volume:  Normal  Mood:  Euthymic  Affect:  Congruent  Thought Process:  Goal Directed and Descriptions of Associations: Intact  Orientation:  Full (Time, Place, and Person)  Thought Content: Logical   Suicidal Thoughts:  No  Homicidal Thoughts:  No  Memory:  Immediate;   Fair Recent;   Fair Remote;   Fair  Judgement:  Fair  Insight:  Fair  Psychomotor Activity:  Normal  Concentration:  Concentration: Fair and Attention Span: Fair  Recall:  F[employer]Fair  Fund of Knowledge: Fair  Language: Fair  Akathisia:  No  Handed:  Right  AIMS (if indicated):denies tremors, rigidity,stiffness  Assets:  Communication Skills Desire for Improvement Social Support  ADL's:  Intact  Cognition: WNL  Sleep:  restless    Screenings:   Assessment and Plan: Hector George is a 67 year old Caucasian male, married, employed, lives in [place], has a history of bipolar disorder, PTSD, chronic pain, inguinal hernia, cannabis use disorder, tobacco use disorder, alcohol use disorder in remission, presented to the clinic today for a follow-up visit.  Patient is biologically predisposed given his family history of mental health problems.  Patient also has comorbid substance abuse problems and history of trauma.  Patient continues to have some psychosocial stressors as well as struggles with mood lability and sleep issues.  Discussed the following medication changes with patient.  Plan For bipolar disorder Continue Zyprexa 15 mg p.o. nightly. Continue Prozac 40 mg p.o. daily. Increase Depakote to 500 mg p.o. 3 times daily. Reviewed Depakote level-01/15/2018-subtherapeutic at 22.  Discussed this with patient.  Will provide end of the lab slip to get Depakote levels done in a week after taking the new dosage.  PTSD Prozac and Zyprexa as prescribed. Continue prazosin 1 mg p.o. nightly Add trazodone 50-100 mg p.o. nightly as needed for sleep Continue psychotherapy with Ms. Tasia Catchingsraig.  For insomnia Add trazodone 50-100 mg p.o. nightly as needed Prazosin 1 mg p.o. nightly  For alcohol use disorder in remission Patient continues to stay sober.  For cannabis use disorder Provided substance abuse counseling, patient reports he is trying to cut down.  Tobacco use disorder Provided smoking cessation counseling.  Follow-up in clinic in 4 weeks or sooner if needed.  More than 50 % of the time was spent for psychoeducation and supportive psychotherapy and care coordination.  This note was generated in part or whole with voice recognition software. Voice recognition is usually quite accurate but there are transcription errors that can and very often do occur. I apologize for any typographical errors that were not detected and  corrected.           Jomarie LongsSaramma Wahid Holley, MD 01/17/2018, 12:33 PM

## 2018-01-17 NOTE — Patient Instructions (Signed)
Start taking Depakote three times a day .  Start taking Trazodone 50 to 100 mg at bedtime for sleep.  Continue Olanzapine one and half tablets at night.  Continue fluoxetine and prazosin as prescribed .

## 2018-01-17 NOTE — Telephone Encounter (Signed)
received a fax stating that per insurance pt needs a higher dose of olanzapine 10mg  take 1.5 tablets by mouth at bedtime for sleep.

## 2018-01-19 ENCOUNTER — Other Ambulatory Visit: Payer: Self-pay | Admitting: Psychiatry

## 2018-01-19 DIAGNOSIS — F3160 Bipolar disorder, current episode mixed, unspecified: Secondary | ICD-10-CM

## 2018-01-31 ENCOUNTER — Encounter: Payer: Self-pay | Admitting: Licensed Clinical Social Worker

## 2018-01-31 ENCOUNTER — Ambulatory Visit (INDEPENDENT_AMBULATORY_CARE_PROVIDER_SITE_OTHER): Payer: Medicare HMO | Admitting: Licensed Clinical Social Worker

## 2018-01-31 DIAGNOSIS — F3160 Bipolar disorder, current episode mixed, unspecified: Secondary | ICD-10-CM | POA: Diagnosis not present

## 2018-01-31 NOTE — Progress Notes (Signed)
   THERAPIST PROGRESS NOTE  Session Time: 1000  Participation Level: Active  Behavioral Response: CasualAlertNA  Type of Therapy: Individual Therapy  Treatment Goals addressed: Coping  Interventions: Supportive  Summary: Hector George is a 67 y.o. male who presents with symptoms related to his diagnosis. Hector George reports things have been "a lot better since last time I saw you." Hector George reports since our last session his sleep has improved drastically. He states he took LCSW's suggestion and began having "sleepy time tea," before bed, which has allowed him to sleep better and more easily. Hector George also reports getting along better with his wife. He reports utilizing the serenity prayer to accept not having control over his wife's actions, and accepting what he does have control over. He reports he has moved a camper into the yard which he uses to play music in (as his wife did not enjoy him playing music in the house), which has allowed him to have his own space. Hector George reports this has been helpful. He reports his mood has been more stable, though he reports a two week span of "depression. I call it the black dog being around." Hector George reports his mood has improved over the last week, however. Hector George also reported, "I cut way back on smoking pot too." Hector George stated he has not noticed any negative side effects since reducing his marijuanna consumption.  Suicidal/Homicidal: No  Therapist Response: Hector George is able to take concepts discussed in session and apply them to his daily life in order to improve his emotional regulation. Moving forward, we will continue to utilize CBT and DBT skills to manage Hector George's depression and mood symptoms.   Plan: Return again in 4 weeks.  Diagnosis: Axis I: Bipolar, mixed    Axis II: No diagnosis    Heidi DachKelsey Tymere Depuy, LCSW 01/31/2018

## 2018-02-03 ENCOUNTER — Inpatient Hospital Stay: Admission: RE | Admit: 2018-02-03 | Payer: Medicare HMO | Source: Ambulatory Visit

## 2018-02-03 ENCOUNTER — Telehealth: Payer: Self-pay

## 2018-02-03 NOTE — Telephone Encounter (Signed)
[facility] billing dept called states that the labs on  10-24-17 was denied and they need to speak with md about coding.  please call them at [phone] ref # [id number]

## 2018-02-06 NOTE — Telephone Encounter (Signed)
Called [facility]. Says they cannot find patient .

## 2018-02-07 ENCOUNTER — Other Ambulatory Visit: Payer: Self-pay

## 2018-02-07 ENCOUNTER — Encounter
Admission: RE | Admit: 2018-02-07 | Discharge: 2018-02-07 | Disposition: A | Discharge status: Home / Self Care | Payer: Medicare HMO | Source: Ambulatory Visit | Attending: Surgery | Admitting: Surgery

## 2018-02-07 DIAGNOSIS — Z01810 Encounter for preprocedural cardiovascular examination: Secondary | ICD-10-CM | POA: Insufficient documentation

## 2018-02-07 NOTE — Patient Instructions (Signed)
Your procedure is scheduled on: Thursday 02/13/18 Report to DAY SURGERY DEPARTMENT LOCATED ON 2ND FLOOR MEDICAL MALL ENTRANCE. To find out your arrival time please call [phone] between 1PM - 3PM on Wednesday 02/12/18.  Remember: Instructions that are not followed completely may result in serious medical risk, up to and including death, or upon the discretion of your surgeon and anesthesiologist your surgery may need to be rescheduled.     _X__ 1. Do not eat food after midnight the night before your procedure.                 No gum chewing or hard candies. You may drink clear liquids up to 2 hours                 before you are scheduled to arrive for your surgery- DO not drink clear                 liquids within 2 hours of the start of your surgery.                 Clear Liquids include:  water, apple juice without pulp, clear carbohydrate                 drink such as Clearfast or Gatorade, Black Coffee or Tea (Do not add                 anything to coffee or tea).  __X__2.  On the morning of surgery brush your teeth with toothpaste and water, you  may rinse your mouth with mouthwash if you wish.  Do not swallow any  toothpaste of mouthwash.     _X__ 3.  No Alcohol for 24 hours before or after surgery.   _X__ 4.  Do Not Smoke or use e-cigarettes For 24 Hours Prior to Your Surgery.                 Do not use any chewable tobacco products for at least 6 hours prior to                 surgery.  ____  5.  Bring all medications with you on the day of surgery if instructed.   __X__  6.  Notify your doctor if there is any change in your medical condition      (cold, fever, infections).     Do not wear jewelry, make-up, hairpins, clips or nail polish. Do not wear lotions, powders, or perfumes.  Do not shave 48 hours prior to surgery. Men may shave face and neck. Do not bring valuables to the hospital.    [facility] is not responsible for any belongings or valuables.  Contacts,  dentures/partials or body piercings may not be worn into surgery. Bring a case for your contacts, glasses or hearing aids, a denture cup will be supplied. Leave your suitcase in the car. After surgery it may be brought to your room. For patients admitted to the hospital, discharge time is determined by your treatment team.   Patients discharged the day of surgery will not be allowed to drive home.   Please read over the following fact sheets that you were given:   MRSA Information  __X__ Take these medicines the morning of surgery with A SIP OF WATER:    1. divalproex (DEPAKOTE)  2. FLUoxetine (PROZAC)  3.   4.  5.  6.  ____ Fleet Enema (as directed)   __X__ Use CHG  Soap/SAGE wipes as directed  ____ Use inhalers on the day of surgery  ____ Stop metformin/Janumet/Farxiga 2 days prior to surgery    ____ Take 1/2 of usual insulin dose the night before surgery. No insulin the morning          of surgery.   ____ Stop Blood Thinners Coumadin/Plavix/Xarelto/Pleta/Pradaxa/Eliquis/Effient/Aspirin  on   Or contact your Surgeon, Cardiologist or Medical Doctor regarding  ability to stop your blood thinners  __X__ Stop Anti-inflammatories 7 days before surgery such as Advil, Ibuprofen, Motrin,  BC or Goodies Powder, Naprosyn, Naproxen, Aleve, Aspirin    __X__ Stop all herbal supplements, fish oil or vitamin E until after surgery.    ____ Bring C-Pap to the hospital.

## 2018-02-09 ENCOUNTER — Other Ambulatory Visit: Payer: Self-pay | Admitting: Psychiatry

## 2018-02-09 DIAGNOSIS — F3160 Bipolar disorder, current episode mixed, unspecified: Secondary | ICD-10-CM

## 2018-02-09 DIAGNOSIS — F4310 Post-traumatic stress disorder, unspecified: Secondary | ICD-10-CM

## 2018-02-09 DIAGNOSIS — F5105 Insomnia due to other mental disorder: Secondary | ICD-10-CM

## 2018-02-12 MED ORDER — CEFAZOLIN SODIUM-DEXTROSE 2-4 GM/100ML-% IV SOLN
2.0000 g | INTRAVENOUS | Status: AC
  Administered 2018-02-13: 2 g via INTRAVENOUS

## 2018-02-13 ENCOUNTER — Ambulatory Visit: Payer: Medicare HMO | Admitting: Anesthesiology

## 2018-02-13 ENCOUNTER — Encounter
Admission: RE | Disposition: A | Discharge status: Home / Self Care | Payer: Self-pay | Source: Home / Self Care | Attending: Surgery

## 2018-02-13 ENCOUNTER — Ambulatory Visit
Admission: RE | Admit: 2018-02-13 | Discharge: 2018-02-13 | Disposition: A | Discharge status: Home / Self Care | Payer: Medicare HMO | Attending: Surgery | Admitting: Surgery

## 2018-02-13 ENCOUNTER — Other Ambulatory Visit: Payer: Self-pay

## 2018-02-13 DIAGNOSIS — F17210 Nicotine dependence, cigarettes, uncomplicated: Secondary | ICD-10-CM | POA: Insufficient documentation

## 2018-02-13 DIAGNOSIS — F319 Bipolar disorder, unspecified: Secondary | ICD-10-CM | POA: Diagnosis not present

## 2018-02-13 DIAGNOSIS — K4091 Unilateral inguinal hernia, without obstruction or gangrene, recurrent: Secondary | ICD-10-CM

## 2018-02-13 DIAGNOSIS — Z79899 Other long term (current) drug therapy: Secondary | ICD-10-CM | POA: Insufficient documentation

## 2018-02-13 DIAGNOSIS — K4090 Unilateral inguinal hernia, without obstruction or gangrene, not specified as recurrent: Secondary | ICD-10-CM | POA: Diagnosis not present

## 2018-02-13 DIAGNOSIS — Z7982 Long term (current) use of aspirin: Secondary | ICD-10-CM | POA: Insufficient documentation

## 2018-02-13 DIAGNOSIS — I10 Essential (primary) hypertension: Secondary | ICD-10-CM | POA: Diagnosis not present

## 2018-02-13 HISTORY — PX: INGUINAL HERNIA REPAIR: SHX194

## 2018-02-13 LAB — URINE DRUG SCREEN, QUALITATIVE (ARMC ONLY)
Amphetamines, Ur Screen: NOT DETECTED
Barbiturates, Ur Screen: NOT DETECTED
Benzodiazepine, Ur Scrn: POSITIVE — AB
Cannabinoid 50 Ng, Ur ~~LOC~~: POSITIVE — AB
Cocaine Metabolite,Ur ~~LOC~~: NOT DETECTED
MDMA (Ecstasy)Ur Screen: NOT DETECTED
Methadone Scn, Ur: NOT DETECTED
Opiate, Ur Screen: NOT DETECTED
Phencyclidine (PCP) Ur S: NOT DETECTED
Tricyclic, Ur Screen: NOT DETECTED

## 2018-02-13 LAB — PROTIME-INR
INR: 0.88
Prothrombin Time: 11.9 seconds (ref 11.4–15.2)

## 2018-02-13 SURGERY — REPAIR, HERNIA, INGUINAL, ADULT
Anesthesia: General | Laterality: Left

## 2018-02-13 MED ORDER — LIDOCAINE HCL (CARDIAC) PF 100 MG/5ML IV SOSY
PREFILLED_SYRINGE | INTRAVENOUS | Status: DC | PRN
  Administered 2018-02-13: 60 mg via INTRAVENOUS

## 2018-02-13 MED ORDER — ROCURONIUM BROMIDE 50 MG/5ML IV SOLN
INTRAVENOUS | Status: AC
  Filled 2018-02-13: qty 1

## 2018-02-13 MED ORDER — BUPIVACAINE-EPINEPHRINE (PF) 0.5% -1:200000 IJ SOLN
INTRAMUSCULAR | Status: DC | PRN
  Administered 2018-02-13: 5 mL

## 2018-02-13 MED ORDER — MIDAZOLAM HCL 2 MG/2ML IJ SOLN
INTRAMUSCULAR | Status: DC | PRN
  Administered 2018-02-13: 2 mg via INTRAVENOUS

## 2018-02-13 MED ORDER — SODIUM CHLORIDE 0.9 % IV SOLN
INTRAVENOUS | Status: DC | PRN
  Administered 2018-02-13: 30 ug/min via INTRAVENOUS

## 2018-02-13 MED ORDER — DEXMEDETOMIDINE HCL IN NACL 200 MCG/50ML IV SOLN
INTRAVENOUS | Status: DC | PRN
  Administered 2018-02-13 (×3): 8 ug via INTRAVENOUS

## 2018-02-13 MED ORDER — DEXAMETHASONE SODIUM PHOSPHATE 10 MG/ML IJ SOLN
INTRAMUSCULAR | Status: DC | PRN
  Administered 2018-02-13: 5 mg via INTRAVENOUS

## 2018-02-13 MED ORDER — LIDOCAINE HCL (PF) 2 % IJ SOLN
INTRAMUSCULAR | Status: AC
  Filled 2018-02-13: qty 10

## 2018-02-13 MED ORDER — DEXAMETHASONE SODIUM PHOSPHATE 10 MG/ML IJ SOLN
INTRAMUSCULAR | Status: AC
  Filled 2018-02-13: qty 1

## 2018-02-13 MED ORDER — EPHEDRINE SULFATE 50 MG/ML IJ SOLN
INTRAMUSCULAR | Status: DC | PRN
  Administered 2018-02-13 (×2): 10 mg via INTRAVENOUS
  Administered 2018-02-13: 5 mg via INTRAVENOUS
  Administered 2018-02-13: 10 mg via INTRAVENOUS

## 2018-02-13 MED ORDER — DEXMEDETOMIDINE HCL IN NACL 80 MCG/20ML IV SOLN
INTRAVENOUS | Status: AC
  Filled 2018-02-13: qty 20

## 2018-02-13 MED ORDER — FENTANYL CITRATE (PF) 100 MCG/2ML IJ SOLN
INTRAMUSCULAR | Status: DC | PRN
  Administered 2018-02-13: 25 ug via INTRAVENOUS
  Administered 2018-02-13 (×3): 50 ug via INTRAVENOUS
  Administered 2018-02-13: 25 ug via INTRAVENOUS

## 2018-02-13 MED ORDER — PHENYLEPHRINE HCL 10 MG/ML IJ SOLN
INTRAMUSCULAR | Status: DC | PRN
  Administered 2018-02-13 (×2): 100 ug via INTRAVENOUS
  Administered 2018-02-13: 200 ug via INTRAVENOUS
  Administered 2018-02-13: 100 ug via INTRAVENOUS

## 2018-02-13 MED ORDER — DOCUSATE SODIUM 100 MG PO CAPS: 100.0000 mg | ORAL_CAPSULE | Freq: Two times a day (BID) | ORAL | 0 refills | Status: AC | PRN

## 2018-02-13 MED ORDER — BUPIVACAINE-EPINEPHRINE (PF) 0.5% -1:200000 IJ SOLN
INTRAMUSCULAR | Status: AC
  Filled 2018-02-13: qty 90

## 2018-02-13 MED ORDER — PROPOFOL 10 MG/ML IV BOLUS
INTRAVENOUS | Status: AC
  Filled 2018-02-13: qty 20

## 2018-02-13 MED ORDER — HYDROMORPHONE HCL 1 MG/ML IJ SOLN: 0.2500 mg | INTRAMUSCULAR | Status: DC | PRN

## 2018-02-13 MED ORDER — FENTANYL CITRATE (PF) 100 MCG/2ML IJ SOLN
INTRAMUSCULAR | Status: AC
  Filled 2018-02-13: qty 2

## 2018-02-13 MED ORDER — SUCCINYLCHOLINE CHLORIDE 20 MG/ML IJ SOLN
INTRAMUSCULAR | Status: AC
  Filled 2018-02-13: qty 1

## 2018-02-13 MED ORDER — MIDAZOLAM HCL 2 MG/2ML IJ SOLN
INTRAMUSCULAR | Status: AC
  Filled 2018-02-13: qty 2

## 2018-02-13 MED ORDER — HYDROCODONE-ACETAMINOPHEN 5-325 MG PO TABS: 1.0000 | ORAL_TABLET | Freq: Four times a day (QID) | ORAL | 0 refills | Status: AC | PRN

## 2018-02-13 MED ORDER — CEFAZOLIN SODIUM-DEXTROSE 2-4 GM/100ML-% IV SOLN
INTRAVENOUS | Status: AC
  Filled 2018-02-13: qty 100

## 2018-02-13 MED ORDER — SUCCINYLCHOLINE CHLORIDE 20 MG/ML IJ SOLN
INTRAMUSCULAR | Status: DC | PRN
  Administered 2018-02-13: 100 mg via INTRAVENOUS

## 2018-02-13 MED ORDER — ONDANSETRON HCL 4 MG/2ML IJ SOLN
INTRAMUSCULAR | Status: DC | PRN
  Administered 2018-02-13: 4 mg via INTRAVENOUS

## 2018-02-13 MED ORDER — FAMOTIDINE 20 MG PO TABS
20.0000 mg | ORAL_TABLET | Freq: Once | ORAL | Status: AC
  Administered 2018-02-13: 20 mg via ORAL

## 2018-02-13 MED ORDER — ONDANSETRON HCL 4 MG/2ML IJ SOLN
INTRAMUSCULAR | Status: AC
  Filled 2018-02-13: qty 2

## 2018-02-13 MED ORDER — BUPIVACAINE LIPOSOME 1.3 % IJ SUSP
INTRAMUSCULAR | Status: AC
  Filled 2018-02-13: qty 20

## 2018-02-13 MED ORDER — LIDOCAINE HCL 4 % MT SOLN
OROMUCOSAL | Status: DC | PRN
  Administered 2018-02-13: 4 mL via TOPICAL

## 2018-02-13 MED ORDER — BUPIVACAINE LIPOSOME 1.3 % IJ SUSP
INTRAMUSCULAR | Status: DC | PRN
  Administered 2018-02-13: 20 mL

## 2018-02-13 MED ORDER — ROCURONIUM BROMIDE 100 MG/10ML IV SOLN
INTRAVENOUS | Status: DC | PRN
  Administered 2018-02-13: 5 mg via INTRAVENOUS

## 2018-02-13 MED ORDER — LACTATED RINGERS IV SOLN
INTRAVENOUS | Status: DC
  Administered 2018-02-13: 07:00:00 via INTRAVENOUS

## 2018-02-13 MED ORDER — ACETAMINOPHEN 500 MG PO TABS
1000.0000 mg | ORAL_TABLET | ORAL | Status: AC
  Administered 2018-02-13: 1000 mg via ORAL

## 2018-02-13 MED ORDER — ACETAMINOPHEN 500 MG PO TABS
ORAL_TABLET | ORAL | Status: AC
  Administered 2018-02-13: 1000 mg via ORAL
  Filled 2018-02-13: qty 2

## 2018-02-13 MED ORDER — IBUPROFEN 800 MG PO TABS: 800.0000 mg | ORAL_TABLET | Freq: Three times a day (TID) | ORAL | 0 refills | Status: AC | PRN

## 2018-02-13 MED ORDER — FAMOTIDINE 20 MG PO TABS
ORAL_TABLET | ORAL | Status: AC
  Administered 2018-02-13: 20 mg via ORAL
  Filled 2018-02-13: qty 1

## 2018-02-13 MED ORDER — CHLORHEXIDINE GLUCONATE CLOTH 2 % EX PADS: 6.0000 | MEDICATED_PAD | Freq: Once | CUTANEOUS | Status: DC

## 2018-02-13 MED ORDER — ACETAMINOPHEN 325 MG PO TABS: 650.0000 mg | ORAL_TABLET | Freq: Three times a day (TID) | ORAL | 0 refills | Status: AC | PRN

## 2018-02-13 MED ORDER — PROPOFOL 10 MG/ML IV BOLUS
INTRAVENOUS | Status: DC | PRN
  Administered 2018-02-13: 140 mg via INTRAVENOUS

## 2018-02-13 SURGICAL SUPPLY — 35 items
BLADE CLIPPER SURG (BLADE) ×3 IMPLANT
BLADE SURG 15 STRL LF DISP TIS (BLADE) ×1 IMPLANT
BLADE SURG 15 STRL SS (BLADE) ×2
CANISTER SUCT 1200ML W/VALVE (MISCELLANEOUS) ×3 IMPLANT
CHLORAPREP W/TINT 26ML (MISCELLANEOUS) ×3 IMPLANT
COVER WAND RF STERILE (DRAPES) IMPLANT
DERMABOND ADVANCED (GAUZE/BANDAGES/DRESSINGS) ×2
DERMABOND ADVANCED .7 DNX12 (GAUZE/BANDAGES/DRESSINGS) ×1 IMPLANT
DRAIN PENROSE 1/4X12 LTX (DRAIN) ×3 IMPLANT
DRAPE LAPAROTOMY 100X77 ABD (DRAPES) ×3 IMPLANT
ELECT REM PT RETURN 9FT ADLT (ELECTROSURGICAL) ×3
ELECTRODE REM PT RTRN 9FT ADLT (ELECTROSURGICAL) ×1 IMPLANT
GLOVE BIOGEL PI IND STRL 7.0 (GLOVE) ×1 IMPLANT
GLOVE BIOGEL PI INDICATOR 7.0 (GLOVE) ×2
GLOVE SURG SYN 7.0 (GLOVE) ×6 IMPLANT
GOWN STRL REUS W/ TWL LRG LVL3 (GOWN DISPOSABLE) ×2 IMPLANT
GOWN STRL REUS W/TWL LRG LVL3 (GOWN DISPOSABLE) ×4
LABEL OR SOLS (LABEL) ×3 IMPLANT
MESH PARIETEX PROGRIP LEFT (Mesh General) ×3 IMPLANT
NEEDLE HYPO 22GX1.5 SAFETY (NEEDLE) ×6 IMPLANT
NS IRRIG 500ML POUR BTL (IV SOLUTION) ×3 IMPLANT
PACK BASIN MINOR ARMC (MISCELLANEOUS) ×3 IMPLANT
SUT ETHIBOND NAB MO 7 #0 18IN (SUTURE) ×3 IMPLANT
SUT MNCRL 4-0 (SUTURE) ×2
SUT MNCRL 4-0 27XMFL (SUTURE) ×1
SUT SILK 3 0 12 30 (SUTURE) IMPLANT
SUT SILK 3 0 SH 30 (SUTURE) IMPLANT
SUT VIC AB 2-0 CT2 27 (SUTURE) ×3 IMPLANT
SUT VIC AB 3-0 SH 27 (SUTURE) ×2
SUT VIC AB 3-0 SH 27X BRD (SUTURE) ×1 IMPLANT
SUTURE MNCRL 4-0 27XMF (SUTURE) ×1 IMPLANT
SYR 10ML LL (SYRINGE) ×3 IMPLANT
SYR 20CC LL (SYRINGE) ×3 IMPLANT
TOWEL OR 17X26 4PK STRL BLUE (TOWEL DISPOSABLE) IMPLANT
WATER STERILE IRR 1000ML POUR (IV SOLUTION) ×3 IMPLANT

## 2018-02-13 NOTE — Interval H&P Note (Signed)
History and Physical Interval Note:  02/13/2018 7:12 AM  Hector George  has presented today for surgery, with the diagnosis of LEFT INGUINAL HERNIA  The various methods of treatment have been discussed with the patient and family. After consideration of risks, benefits and other options for treatment, the patient has consented to  Procedure(s): HERNIA REPAIR INGUINAL ADULT (Left) as a surgical intervention .  The patient's history has been reviewed, patient examined, no change in status, stable for surgery.  I have reviewed the patient's chart and labs.  Questions were answered to the patient's satisfaction.     Erienne Spelman Tonna BoehringerSakai

## 2018-02-13 NOTE — Transfer of Care (Signed)
Immediate Anesthesia Transfer of Care Note  Patient: Hector George  Procedure(s) Performed: HERNIA REPAIR INGUINAL ADULT (Left )  Patient Location: PACU  Anesthesia Type:General  Level of Consciousness: awake, alert  and oriented  Airway & Oxygen Therapy: Patient Spontanous Breathing and Patient connected to face mask oxygen  Post-op Assessment: Report given to RN and Post -op Vital signs reviewed and stable  Post vital signs: Reviewed and stable  Last Vitals:  Vitals Value Taken Time  BP 143/84 02/13/2018  9:26 AM  Temp 36.1 C 02/13/2018  9:26 AM  Pulse 85 02/13/2018  9:26 AM  Resp 30 02/13/2018  9:26 AM  SpO2 99 % 02/13/2018  9:26 AM  Vitals shown include unvalidated device data.  Last Pain:  Vitals:   02/13/18 0626  TempSrc: Tympanic         Complications: No apparent anesthesia complications

## 2018-02-13 NOTE — Anesthesia Procedure Notes (Signed)
Procedure Name: Intubation Date/Time: 02/13/2018 7:41 AM Performed by: Hedda Slade, CRNA Pre-anesthesia Checklist: Patient identified, Patient being monitored, Timeout performed, Emergency Drugs available and Suction available Patient Re-evaluated:Patient Re-evaluated prior to induction Oxygen Delivery Method: Circle system utilized Preoxygenation: Pre-oxygenation with 100% oxygen Induction Type: IV induction Laryngoscope Size: Mac and 4 Grade View: Grade I Tube type: Oral Tube size: 7.5 mm Number of attempts: 1 Airway Equipment and Method: Stylet Placement Confirmation: ETT inserted through vocal cords under direct vision,  positive ETCO2 and breath sounds checked- equal and bilateral Secured at: 22 cm Tube secured with: Tape Dental Injury: Teeth and Oropharynx as per pre-operative assessment

## 2018-02-13 NOTE — Op Note (Signed)
Preoperative diagnosis: left Inguinal Hernia, initial.  Postoperative diagnosis: left indirect  Inguinal Hernia  Procedure:  Open left Inguinal hernia repair with mesh  Anesthesia: General, LMA  Surgeon: Dr. Tonna BoehringerSakai  Wound Classification: Clean  Specimen: none  Complications: None  Estimated Blood Loss: 10mL   Indications:  Patient is a 67 y.o. male developed a symptomatic left inguinal hernia. Repair was indicated to avoid complications of incarceration, obstruction and pain, and a prosthetic mesh repair was elected.  Findings: 1. Vas Deferens and cord structures identified and preserved 2. Progrip mesh used for repair 3. Adequate hemostasis achieved  Description of procedure: The patient was taken to the operating room. A time-out was completed verifying correct patient, procedure, site, positioning, and implant(s) and/or special equipment prior to beginning this procedure. The left groin was prepped and draped in the usual sterile fashion. An incision was marked in a natural skin crease and planned to end near the pubic tubercle.  The skin crease incision was made with a knife and deepened through Scarpa's and Camper's fascia with electrocautery until the aponeurosis of the external oblique was encountered. This was cleaned and the external ring was exposed. Hemostasis was achieved in the wound. An incision was made in the midportion of the external oblique aponeurosis in the direction of its fibers. Ilioinguinal nerve was ligated to optimize access to operative field.  Flaps of the external oblique were developed cephalad and inferiorly.  The cord was identified. It was gently dissected free at the pubic tubercle and encircled with a Penrose drain. Attention was directed to the anteromedial aspect of the cord, where an indirect hernia sac was identified. The sac was carefully dissected free of the cord down to the level of the internal ring and suture ligated with 3-0 vicrl before stump  reduced back into abdominal cavity.  Sac sent off operative field pending pathology. The vas and testicular vessels were identified and protected from harm.   Attention then turned to the floor of the canal, which was intact. The Progrip mesh was inserted and secured to the pubic tubercle  And one suture in cooper's ligament using interrupted 0 ethibond sutures. Care was taken to assure that the mesh was placed flat against the floor and wrapped loosely around the cord structures.  Hemostasis was again checked. The Penrose drain was removed. Exparel infused as an ilioinguinal block.  The external oblique aponeurosis was closed with a running suture of 2-0 Vicryl, taking care not to catch the ilioinguinal nerve in the suture line. Scarpa's fascia was closed with interrupted 3-0 Vicryl.  The deep dermal layer closed with interrupted 3-0 Vicryl.  The skin was closed with a subcuticular stitch of Monocryl 4-0. Dermabond was applied.  The testis was gently pulled down into its anatomic position in the scrotum.  The patient tolerated the procedure well and was taken to the postanesthesia care unit in stable condition. Sponge and instrument count correct at end of procedure.

## 2018-02-13 NOTE — Progress Notes (Deleted)
BH MD OP Progress Note  02/13/2018 6:21 PM Hector George  MRN:  [id number]  Chief Complaint:  HPI: *** Visit Diagnosis: No diagnosis found.  Past Psychiatric History: ***  Past Medical History:  Past Medical History:  Diagnosis Date  . Affective bipolar disorder (HCC)   . Anxiety   . Claudication, class I (HCC)   . Depression   . Left inguinal hernia   . Lumbago   . Nightmares associated with chronic post-traumatic stress disorder   . Osteoarthritis     Past Surgical History:  Procedure Laterality Date  . BACK SURGERY    . CERVICAL SPINE SURGERY    . INGUINAL HERNIA REPAIR Left 02/13/2018   Procedure: HERNIA REPAIR INGUINAL ADULT;  Surgeon: Hector George, Isami, DO;  Location: [facility] ORS;  Service: General;  Laterality: Left;  . NOSE SURGERY      Family Psychiatric History: ***  Family History:  Family History  Problem Relation Age of Onset  . Heart disease Mother   . Anxiety disorder Brother   . Depression Brother   . Anxiety disorder Sister   . Depression Sister   . Cancer Neg Hx     Social History:  Social History   Socioeconomic History  . Marital status: Married    Spouse name: Hector George  . Number of children: 1  . Years of education: Not on file  . Highest education level: Some college, no degree  Occupational History  . Not on file  Social Needs  . Financial resource strain: Hard  . Food insecurity:    Worry: Sometimes true    Inability: Sometimes true  . Transportation needs:    Medical: Yes    Non-medical: Yes  Tobacco Use  . Smoking status: Current Every Day Smoker    Packs/day: 0.25    Types: Cigarettes  . Smokeless tobacco: Never Used  Substance and Sexual Activity  . Alcohol use: No  . Drug use: Yes    Types: Marijuana    Comment: daily  . Sexual activity: Yes  Lifestyle  . Physical activity:    Days per week: 0 days    Minutes per session: 0 min  . Stress: Very much  Relationships  . Social connections:    Talks on phone: Not on file     Gets together: Not on file    Attends religious service: Never    Active member of club or organization: No    Attends meetings of clubs or organizations: Never    Relationship status: Married  Other Topics Concern  . Not on file  Social History Narrative  . Not on file    Allergies: No Known Allergies  Metabolic Disorder Labs: Lab Results  Component Value Date   HGBA1C 5.6 10/24/2017   Lab Results  Component Value Date   PROLACTIN 5.6 10/24/2017   Lab Results  Component Value Date   CHOL 172 10/24/2017   TRIG 75 10/24/2017   HDL 54 10/24/2017   LDLCALC 103 (H) 10/24/2017   Lab Results  Component Value Date   TSH 0.951 10/24/2017    Therapeutic Level Labs: No results found for: LITHIUM Lab Results  Component Value Date   VALPROATE 22 (L) 01/14/2018   No components found for:  CBMZ  Current Medications: Current Outpatient Medications  Medication Sig Dispense Refill  . acetaminophen (TYLENOL) 325 MG tablet Take 2 tablets (650 mg total) by mouth every 8 (eight) hours as needed for mild pain. 40 tablet 0  . divalproex (  DEPAKOTE) 250 MG DR tablet Take 250 mg by mouth 3 (three) times daily.    . divalproex (DEPAKOTE) 500 MG DR tablet TAKE 1 TABLET BY MOUTH THREE TIMES A DAY (Patient not taking: Reported on 02/13/2018) 90 tablet 1  . docusate sodium (COLACE) 100 MG capsule Take 1 capsule (100 mg total) by mouth 2 (two) times daily. (Patient not taking: Reported on 01/31/2018) 30 capsule 2  . docusate sodium (COLACE) 100 MG capsule Take 1 capsule (100 mg total) by mouth 2 (two) times daily as needed for up to 10 days for mild constipation. 20 capsule 0  . FLUoxetine (PROZAC) 20 MG capsule Take 40 mg by mouth daily.    Marland Kitchen. FLUoxetine (PROZAC) 40 MG capsule Take 1 capsule (40 mg total) by mouth daily. Pt has supplies (Patient not taking: Reported on 01/31/2018) 30 capsule 3  . HYDROcodone-acetaminophen (NORCO) 5-325 MG tablet Take 1 tablet by mouth every 6 (six) hours as needed  for up to 3 days for moderate pain. 12 tablet 0  . ibuprofen (ADVIL,MOTRIN) 800 MG tablet Take 1 tablet (800 mg total) by mouth every 8 (eight) hours as needed for mild pain or moderate pain. 30 tablet 0  . OLANZapine (ZYPREXA) 15 MG tablet TAKE 1 TABLET (15 MG TOTAL) BY MOUTH AT BEDTIME. FOR MOOD AND SLEEP 90 tablet 1  . prazosin (MINIPRESS) 1 MG capsule Take 1 capsule (1 mg total) by mouth at bedtime. For nightmares 30 capsule 2  . terazosin (HYTRIN) 2 MG capsule Take 4 mg by mouth at bedtime.    . traMADol (ULTRAM) 50 MG tablet Take 1 tablet (50 mg total) by mouth every 6 (six) hours as needed. (Patient not taking: Reported on 02/13/2018) 20 tablet 0  . traZODone (DESYREL) 50 MG tablet TAKE 1-2 TABLETS (50-100 MG TOTAL) BY MOUTH AT BEDTIME. FOR SLEEP 180 tablet 1   No current facility-administered medications for this visit.      Musculoskeletal: Strength & Muscle Tone: {desc; muscle tone:32375} Gait & Station: {PE GAIT ED ZOXW:96045}ATL:22525} Patient leans: {Patient Leans:21022755}  Psychiatric Specialty Exam: ROS  There were no vitals taken for this visit.There is no height or weight on file to calculate BMI.  General Appearance: {Appearance:22683}  Eye Contact:  {BHH EYE CONTACT:22684}  Speech:  {Speech:22685}  Volume:  {Volume (PAA):22686}  Mood:  {BHH MOOD:22306}  Affect:  {Affect (PAA):22687}  Thought Process:  {Thought Process (PAA):22688}  Orientation:  {BHH ORIENTATION (PAA):22689}  Thought Content: {Thought Content:22690}   Suicidal Thoughts:  {ST/HT (PAA):22692}  Homicidal Thoughts:  {ST/HT (PAA):22692}  Memory:  {BHH MEMORY:22881}  Judgement:  {Judgement (PAA):22694}  Insight:  {Insight (PAA):22695}  Psychomotor Activity:  {Psychomotor (PAA):22696}  Concentration:  {Concentration:21399}  Recall:  {BHH GOOD/FAIR/POOR:22877}  Fund of Knowledge: {BHH GOOD/FAIR/POOR:22877}  Language: {BHH GOOD/FAIR/POOR:22877}  Akathisia:  {BHH YES OR NO:22294}  Handed:  {Handed:22697}   AIMS (if indicated): {Desc; done/not:10129}  Assets:  {Assets (PAA):22698}  ADL's:  {BHH WUJ'W:11914}ADL'S:22290}  Cognition: {chl bhh cognition:304700322}  Sleep:  {BHH GOOD/FAIR/POOR:22877}   Screenings:   Assessment and Plan: ***   Jomarie LongsSaramma Jacen Carlini, MD 02/13/2018, 6:21 PM

## 2018-02-13 NOTE — Discharge Instructions (Signed)

## 2018-02-13 NOTE — Anesthesia Post-op Follow-up Note (Signed)
Anesthesia QCDR form completed.        

## 2018-02-13 NOTE — Anesthesia Preprocedure Evaluation (Addendum)
Anesthesia Evaluation  Patient identified by MRN, date of birth, ID band Patient awake    Reviewed: Allergy & Precautions, H&P , NPO status , Patient's Chart, lab work & pertinent test results  Airway Mallampati: III       Dental  (+) Missing, Chipped, Poor Dentition   Pulmonary neg COPD, neg recent URI, Current Smoker,           Cardiovascular (-) angina(-) Past MI and (-) Cardiac Stents negative cardio ROS  (-) dysrhythmias      Neuro/Psych PSYCHIATRIC DISORDERS Anxiety Depression Bipolar Disorder negative neurological ROS     GI/Hepatic negative GI ROS, Neg liver ROS,   Endo/Other  negative endocrine ROS  Renal/GU      Musculoskeletal   Abdominal   Peds  Hematology negative hematology ROS (+)   Anesthesia Other Findings Past Medical History: No date: Affective bipolar disorder (HCC) No date: Anxiety No date: Claudication, class I (HCC) No date: Depression No date: Left inguinal hernia No date: Lumbago No date: Nightmares associated with chronic post-traumatic stress  disorder No date: Osteoarthritis  Past Surgical History: No date: BACK SURGERY No date: CERVICAL SPINE SURGERY No date: NOSE SURGERY  BMI    Body Mass Index:  19.46 kg/m      Reproductive/Obstetrics negative OB ROS                            Anesthesia Physical Anesthesia Plan  ASA: II  Anesthesia Plan: General ETT   Post-op Pain Management:    Induction:   PONV Risk Score and Plan: Ondansetron, Dexamethasone and Treatment may vary due to age or medical condition  Airway Management Planned:   Additional Equipment:   Intra-op Plan:   Post-operative Plan:   Informed Consent: I have reviewed the patients History and Physical, chart, labs and discussed the procedure including the risks, benefits and alternatives for the proposed anesthesia with the patient or authorized representative who has  indicated his/her understanding and acceptance.   Dental Advisory Given  Plan Discussed with: Anesthesiologist, CRNA and Surgeon  Anesthesia Plan Comments:         Anesthesia Quick Evaluation

## 2018-02-14 ENCOUNTER — Ambulatory Visit: Payer: Medicare HMO | Admitting: Psychiatry

## 2018-02-14 LAB — SURGICAL PATHOLOGY

## 2018-02-14 NOTE — Anesthesia Postprocedure Evaluation (Signed)
Anesthesia Post Note  Patient: Hector George  Procedure(s) Performed: HERNIA REPAIR INGUINAL ADULT (Left )  Patient location during evaluation: PACU Anesthesia Type: General Level of consciousness: awake and alert Pain management: pain level controlled Vital Signs Assessment: post-procedure vital signs reviewed and stable Respiratory status: spontaneous breathing, nonlabored ventilation, respiratory function stable and patient connected to nasal cannula oxygen Cardiovascular status: blood pressure returned to baseline and stable Postop Assessment: no apparent nausea or vomiting Anesthetic complications: no     Last Vitals:  Vitals:   02/13/18 1018 02/13/18 1046  BP: 122/63 (!) 117/50  Pulse: 69 71  Resp: 16 17  Temp: (!) 36.1 C 36.4 C  SpO2: 95% 97%    Last Pain:  Vitals:   02/14/18 0837  TempSrc:   PainSc: 0-No pain                 Hector George

## 2018-03-10 DIAGNOSIS — F419 Anxiety disorder, unspecified: Secondary | ICD-10-CM | POA: Diagnosis not present

## 2018-03-10 DIAGNOSIS — M1990 Unspecified osteoarthritis, unspecified site: Secondary | ICD-10-CM | POA: Diagnosis not present

## 2018-03-10 DIAGNOSIS — F329 Major depressive disorder, single episode, unspecified: Secondary | ICD-10-CM | POA: Diagnosis not present

## 2018-03-10 DIAGNOSIS — I959 Hypotension, unspecified: Secondary | ICD-10-CM | POA: Diagnosis not present

## 2018-04-03 ENCOUNTER — Other Ambulatory Visit: Payer: Self-pay

## 2018-04-03 ENCOUNTER — Ambulatory Visit (INDEPENDENT_AMBULATORY_CARE_PROVIDER_SITE_OTHER): Payer: Medicare HMO | Admitting: Licensed Clinical Social Worker

## 2018-04-03 ENCOUNTER — Ambulatory Visit (INDEPENDENT_AMBULATORY_CARE_PROVIDER_SITE_OTHER): Payer: Medicare HMO | Admitting: Psychiatry

## 2018-04-03 ENCOUNTER — Encounter: Payer: Self-pay | Admitting: Licensed Clinical Social Worker

## 2018-04-03 ENCOUNTER — Encounter: Payer: Self-pay | Admitting: Psychiatry

## 2018-04-03 VITALS — BP 109/71 | HR 67 | Temp 97.6°F | Wt 131.0 lb

## 2018-04-03 DIAGNOSIS — F1220 Cannabis dependence, uncomplicated: Secondary | ICD-10-CM | POA: Diagnosis not present

## 2018-04-03 DIAGNOSIS — F4310 Post-traumatic stress disorder, unspecified: Secondary | ICD-10-CM

## 2018-04-03 DIAGNOSIS — F5105 Insomnia due to other mental disorder: Secondary | ICD-10-CM | POA: Diagnosis not present

## 2018-04-03 DIAGNOSIS — F17200 Nicotine dependence, unspecified, uncomplicated: Secondary | ICD-10-CM | POA: Diagnosis not present

## 2018-04-03 DIAGNOSIS — F1021 Alcohol dependence, in remission: Secondary | ICD-10-CM | POA: Diagnosis not present

## 2018-04-03 DIAGNOSIS — F3160 Bipolar disorder, current episode mixed, unspecified: Secondary | ICD-10-CM | POA: Diagnosis not present

## 2018-04-03 MED ORDER — PRAZOSIN HCL 1 MG PO CAPS: 1.0000 mg | ORAL_CAPSULE | Freq: Every day | ORAL | 0 refills | Status: DC

## 2018-04-03 MED ORDER — DIVALPROEX SODIUM 500 MG PO DR TAB: 500.0000 mg | DELAYED_RELEASE_TABLET | Freq: Three times a day (TID) | ORAL | 0 refills | Status: DC

## 2018-04-03 MED ORDER — FLUOXETINE HCL 40 MG PO CAPS: 40.0000 mg | ORAL_CAPSULE | Freq: Every day | ORAL | 0 refills | Status: DC

## 2018-04-03 NOTE — Progress Notes (Signed)
   THERAPIST PROGRESS NOTE  Session Time: 1000-1015  Participation Level: Minimal  Behavioral Response: DisheveledAlertDepressed  Type of Therapy: Individual Therapy  Treatment Goals addressed: Coping  Interventions: Supportive  Summary: Hector George is a 68 y.o. male who presents with continued symptoms of his diagnosis. Hector George reports he has been "very depressed and down," since our last session. He states he ran out of medication "whenever our last appointment was, maybe 2 months ago," and haven't had any since. LCSW asked if pt had contacted the MD. He stated he had not and "maybe I should have done that." Hector George reports other than being out of medications, "nothing has changed." He reports continued improved sleep since not drinking caffeine in the evenings. He reports getting along well with his wife. The only complaint Hector George had was having no motivation to do things he used to do. LCSW provided psychoeducation on depression and medication adherence and how the two are correlated. Hector George expressed agreement. LCSW asked nurse to speak with [facility] regarding his medications.   Suicidal/Homicidal: No  Therapist Response: [name] continues to manage his emotions well inbetween sessions; however, he reports medication non-adherence. We will continue to utilize supportive therapies in order to assist him in regulating his emotions.   Plan: Return again in 12 weeks.  Diagnosis: Axis I: bipolar 1 disorder    Axis II: No diagnosis    Hector Dach, LCSW 04/03/2018

## 2018-04-03 NOTE — Progress Notes (Signed)
BH MD OP Progress Note  04/03/2018 12:19 PM Hector George  MRN:  [id number]  Chief Complaint: ' I am here for follow up." Chief Complaint    Follow-up; Medication Refill     HPI: Hector George is a 68 year old Caucasian male, employed, married, lives in [place], has a history of bipolar disorder, PTSD, insomnia, alcohol use disorder in remission, cannabis use disorder, tobacco use disorder, chronic pain, recent inguinal hernia surgery, presented to clinic today for a follow-up visit.  Patient today presented as a walk-in.  Patient reports he has run out off a lot of his medication and hence wanted to be seen for an evaluation.  Patient reports he has noticed some depressive symptoms recently.  He reports some sadness, mood lability, irritability.  He reports he wants to get back on his Depakote which has helped.  Patient denies any suicidality or homicidality.  Patient reports he continues to have trazodone which he takes on and off and it is helpful with his sleep.  Patient reports he had recent inguinal hernia surgery in December 2019 and he is recovering well.  He denies any pain at this time.  He continues to have good social support system from his wife.  He continues to be in therapy session with Ms. Heidi Dach and his finding it beneficial. Visit Diagnosis:    ICD-10-CM   1. Bipolar I disorder, most recent episode mixed (HCC) F31.60 divalproex (DEPAKOTE) 500 MG DR tablet    FLUoxetine (PROZAC) 40 MG capsule    prazosin (MINIPRESS) 1 MG capsule  2. PTSD (post-traumatic stress disorder) F43.10 prazosin (MINIPRESS) 1 MG capsule  3. Insomnia due to mental condition F51.05 prazosin (MINIPRESS) 1 MG capsule  4. Cannabis use disorder, moderate, dependence (HCC) F12.20   5. Alcohol use disorder, severe, in sustained remission (HCC) F10.21   6. Tobacco use disorder F17.200     Past Psychiatric History: Reviewed past psychiatric history from my progress note on 10/24/2017.  Past Medical  History:  Past Medical History:  Diagnosis Date  . Affective bipolar disorder (HCC)   . Anxiety   . Claudication, class I (HCC)   . Depression   . Left inguinal hernia   . Lumbago   . Nightmares associated with chronic post-traumatic stress disorder   . Osteoarthritis     Past Surgical History:  Procedure Laterality Date  . BACK SURGERY    . CERVICAL SPINE SURGERY    . INGUINAL HERNIA REPAIR Left 02/13/2018   Procedure: HERNIA REPAIR INGUINAL ADULT;  Surgeon: Sung Amabile, DO;  Location: [facility] ORS;  Service: General;  Laterality: Left;  . NOSE SURGERY      Family Psychiatric History: Reviewed family psychiatric history from my progress note on 10/24/2017.  Family History:  Family History  Problem Relation Age of Onset  . Heart disease Mother   . Anxiety disorder Brother   . Depression Brother   . Anxiety disorder Sister   . Depression Sister   . Cancer Neg Hx     Social History: Reviewed social history from my progress note on 10/24/2017. Social History   Socioeconomic History  . Marital status: Married    Spouse name: ruth  . Number of children: 1  . Years of education: Not on file  . Highest education level: Some college, no degree  Occupational History  . Not on file  Social Needs  . Financial resource strain: Hard  . Food insecurity:    Worry: Sometimes true    Inability: Sometimes true  .  Transportation needs:    Medical: Yes    Non-medical: Yes  Tobacco Use  . Smoking status: Current Every Day Smoker    Packs/day: 0.25    Types: Cigarettes  . Smokeless tobacco: Never Used  Substance and Sexual Activity  . Alcohol use: No  . Drug use: Yes    Types: Marijuana    Comment: daily  . Sexual activity: Yes  Lifestyle  . Physical activity:    Days per week: 0 days    Minutes per session: 0 min  . Stress: Very much  Relationships  . Social connections:    Talks on phone: Not on file    Gets together: Not on file    Attends religious service: Never     Active member of club or organization: No    Attends meetings of clubs or organizations: Never    Relationship status: Married  Other Topics Concern  . Not on file  Social History Narrative  . Not on file    Allergies: No Known Allergies  Metabolic Disorder Labs: Lab Results  Component Value Date   HGBA1C 5.6 10/24/2017   Lab Results  Component Value Date   PROLACTIN 5.6 10/24/2017   Lab Results  Component Value Date   CHOL 172 10/24/2017   TRIG 75 10/24/2017   HDL 54 10/24/2017   LDLCALC 103 (H) 10/24/2017   Lab Results  Component Value Date   TSH 0.951 10/24/2017    Therapeutic Level Labs: No results found for: LITHIUM Lab Results  Component Value Date   VALPROATE 22 (L) 01/14/2018   No components found for:  CBMZ  Current Medications: Current Outpatient Medications  Medication Sig Dispense Refill  . divalproex (DEPAKOTE) 500 MG DR tablet Take 1 tablet (500 mg total) by mouth 3 (three) times daily. 270 tablet 0  . docusate sodium (COLACE) 100 MG capsule Take 1 capsule (100 mg total) by mouth 2 (two) times daily. 30 capsule 2  . FLUoxetine (PROZAC) 40 MG capsule Take 1 capsule (40 mg total) by mouth daily. 90 capsule 0  . ibuprofen (ADVIL,MOTRIN) 800 MG tablet Take 1 tablet (800 mg total) by mouth every 8 (eight) hours as needed for mild pain or moderate pain. 30 tablet 0  . OLANZapine (ZYPREXA) 15 MG tablet TAKE 1 TABLET (15 MG TOTAL) BY MOUTH AT BEDTIME. FOR MOOD AND SLEEP 90 tablet 1  . prazosin (MINIPRESS) 1 MG capsule Take 1 capsule (1 mg total) by mouth at bedtime. For nightmares 90 capsule 0  . traMADol (ULTRAM) 50 MG tablet Take 1 tablet (50 mg total) by mouth every 6 (six) hours as needed. 20 tablet 0  . traZODone (DESYREL) 50 MG tablet TAKE 1-2 TABLETS (50-100 MG TOTAL) BY MOUTH AT BEDTIME. FOR SLEEP 180 tablet 1   No current facility-administered medications for this visit.      Musculoskeletal: Strength & Muscle Tone: within normal limits Gait &  Station: normal Patient leans: N/A  Psychiatric Specialty Exam: Review of Systems  Psychiatric/Behavioral: Positive for depression and substance abuse. The patient is nervous/anxious.   All other systems reviewed and are negative.   Blood pressure 109/71, pulse 67, temperature 97.6 F (36.4 C), temperature source Oral, weight 131 lb (59.4 kg).Body mass index is 19.92 kg/m.  General Appearance: Casual  Eye Contact:  Fair  Speech:  Normal Rate  Volume:  Normal  Mood:  Anxious and Depressed  Affect:  Congruent  Thought Process:  Goal Directed and Descriptions of Associations: Intact  Orientation:  Full (Time, Place, and Person)  Thought Content: Logical   Suicidal Thoughts:  No  Homicidal Thoughts:  No  Memory:  Immediate;   Fair Recent;   Fair Remote;   Fair  Judgement:  Fair  Insight:  Fair  Psychomotor Activity:  Normal  Concentration:  Concentration: Fair and Attention Span: Fair  Recall:  [employer]  Fund of Knowledge: Fair  Language: Fair  Akathisia:  No  Handed:  Right  AIMS (if indicated): denies tremors, rigidity,stiffness  Assets:  Communication Skills Desire for Improvement Social Support  ADL's:  Intact  Cognition: WNL  Sleep:  Fair   Screenings:   Assessment and Plan: [name] is a 68 year old Caucasian male, married, employed, lives in [place], has a history of bipolar disorder, PTSD, chronic pain, recent inguinal hernia surgery, cannabis use disorder, tobacco use disorder, alcohol use disorder in remission, presented to clinic today for a follow-up visit.  Patient is biologically predisposed given his family history of mental health problems.  Patient also has comorbid substance abuse problems as well as history of trauma.  Patient has been noncompliant with his medications as well as follow-up visit.  Patient had recent inguinal hernia surgery which also could have contributed to the same.  Patient is currently recovering well and wants to get back on his  medications.  Plan For bipolar disorder-unstable Start Zyprexa 15 mg p.o. nightly Restart Prozac 40 mg p.o. daily Increase Depakote to 500 mg p.o. 3 times daily. Will get Depakote labs since Depakote levels can get toxic in his system if not monitored closely.  Provided him lab slip today to go to [facility] in a week.  For PTSD-unstable Prozac and Zyprexa as prescribed Restart prazosin 1 mg p.o. nightly Trazodone 50 to 100 mg p.o. nightly as needed for sleep. Continue psychotherapy sessions with Ms. Tasia Catchingsraig.  For insomnia- improving Trazodone 50 to 100 mg p.o. nightly as needed Prazosin 1 mg p.o. nightly  For alcohol use disorder in remission Patient continues to stay sober.  Cannabis use disorder-improving Provided substance abuse counseling, he is trying to cut down.  Tobacco use disorder-unstable Provided smoking cessation counseling.  Follow-up in clinic in 1 month or sooner if needed.  I have spent atleast 25 minutes face to face with patient today. More than 50 % of the time was spent for psychoeducation and supportive psychotherapy and care coordination.  This note was generated in part or whole with voice recognition software. Voice recognition is usually quite accurate but there are transcription errors that can and very often do occur. I apologize for any typographical errors that were not detected and corrected.        Jomarie LongsSaramma Jimmie Rueter, MD 04/03/2018, 12:19 PM

## 2018-04-03 NOTE — Patient Instructions (Signed)
Serotonin Syndrome  Serotonin is a chemical in your body (neurotransmitter) that helps to control several functions, such as:  · Brain and nerve cell function.  · Mood and emotions.  · Memory.  · Eating.  · Sleeping.  · Sexual activity.  · Stress response.  Having too much serotonin in your body can cause serotonin syndrome. This condition can be harmful to your brain and nerve cells. This can be a life-threatening condition.  What are the causes?  This condition may be caused by taking medicines or drugs that increase the level of serotonin in your body, such as:  · Antidepressant medicines.  · Migraine medicines.  · Certain pain medicines.  · Certain drugs, including ecstasy, LSD, cocaine, and amphetamines.  · Over-the-counter cough or cold medicines that contain dextromethorphan.  · Certain herbal supplements, including St. John's wort, ginseng, and nutmeg.  This condition usually occurs when you take these medicines or drugs in combination, but it can also happen with a high dose of a single medicine or drug.  What increases the risk?  You are more likely to develop this condition if:  · You just started taking a medicine or drug that increases the level of serotonin in the body.  · You recently increased the dose of a medicine or drug that increases the level of serotonin in the body.  · You take more than one medicine or drug that increases the level of serotonin in the body.  What are the signs or symptoms?  Symptoms of this condition usually start within several hours of taking a medicine or drug. Symptoms may be mild or severe. Mild symptoms include:  · Sweating.  · Restlessness or agitation.  · Muscle twitching or stiffness.  · Rapid heart rate.  · Nausea and vomiting.  · Diarrhea.  · Headache.  · Shivering or goose bumps.  · Confusion.  Severe symptoms include:  · Irregular heartbeat.  · Seizures.  · Loss of consciousness.  · High fever.  How is this diagnosed?  This condition may be diagnosed based  on:  · Your medical history.   · A physical exam.  · Your prior use of drugs and medicines.  · Blood or urine tests. These may be used to rule out other causes of your symptoms.  How is this treated?  The treatment for this condition depends on the severity of your symptoms.  · For mild cases, stopping the medicine or drug that caused your condition is usually all that is needed.  · For moderate to severe cases, treatment in a hospital may be needed to prevent or manage life-threatening symptoms. This may include medicines to control your symptoms, IV fluids, interventions to support your breathing, and treatments to control your body temperature.  Follow these instructions at home:  Medicines    · Take over-the-counter and prescription medicines only as told by your health care provider. This is important.  · Check with your health care provider before you start taking any new prescriptions, over-the-counter medicines, herbs, or supplements.  · Avoid combining any medicines that can cause this condition to occur.  Lifestyle    · Maintain a healthy lifestyle.  ? Eat a healthy diet that includes plenty of vegetables, fruits, whole grains, low-fat dairy products, and lean protein. Do not eat a lot of foods that are high in fat, added sugars, or salt.  ? Get the right amount and quality of sleep. Most adults need 7-9 hours of sleep each night.  ?   Make time to exercise, even if it is only for short periods of time. Most adults should exercise for at least 150 minutes each week.  ? Do not drink alcohol.  ? Do not use illegal drugs, and do not take medicines for reasons other than they are prescribed.  General instructions  · Do not use any products that contain nicotine or tobacco, such as cigarettes and e-cigarettes. If you need help quitting, ask your health care provider.  · Keep all follow-up visits as told by your health care provider. This is important.  Contact a health care provider if:  · Your symptoms do not  improve or they get worse.  Get help right away if you:  · Have worsening confusion, severe headache, chest pain, high fever, seizures, or loss of consciousness.  · Experience serious side effects of medicine, such as swelling of your face, lips, tongue, or throat.  · Have serious thoughts about hurting yourself or others.  These symptoms may represent a serious problem that is an emergency. Do not wait to see if the symptoms will go away. Get medical help right away. Call your local emergency services (911 in the [place] Do not drive yourself to the hospital.  If you ever feel like you may hurt yourself or others, or have thoughts about taking your own life, get help right away. You can go to your nearest emergency department or call:  · Your local emergency services (911 in the [place]  · ·A suicide crisis helpline, such as the National Suicide Prevention Lifeline at 1-800-273-8255. This is open 24 hours a day.  Summary  · Serotonin is a brain chemical that helps to regulate the nervous system. High levels of serotonin in the body can cause serotonin syndrome, which is a very dangerous condition.  · This condition may be caused by taking medicines or drugs that increase the level of serotonin in your body.  · Treatment depends on the severity of your symptoms. For mild cases, stopping the medicine or drug that caused your condition is usually all that is needed.  · Check with your health care provider before you start taking any new prescriptions, over-the-counter medicines, herbs, or supplements.  This information is not intended to replace advice given to you by your health care provider. Make sure you discuss any questions you have with your health care provider.  Document Released: 03/22/2004 Document Revised: 03/22/2017 Document Reviewed: 03/22/2017  Elsevier Interactive Patient Education © 2019 [employer]

## 2018-05-02 ENCOUNTER — Encounter: Payer: Self-pay | Admitting: Psychiatry

## 2018-05-02 ENCOUNTER — Other Ambulatory Visit: Payer: Self-pay

## 2018-05-02 ENCOUNTER — Ambulatory Visit (INDEPENDENT_AMBULATORY_CARE_PROVIDER_SITE_OTHER): Payer: Medicare HMO | Admitting: Psychiatry

## 2018-05-02 ENCOUNTER — Other Ambulatory Visit: Payer: Self-pay | Admitting: Psychiatry

## 2018-05-02 VITALS — BP 129/81 | HR 75 | Temp 97.9°F | Wt 141.0 lb

## 2018-05-02 DIAGNOSIS — F1021 Alcohol dependence, in remission: Secondary | ICD-10-CM

## 2018-05-02 DIAGNOSIS — F3160 Bipolar disorder, current episode mixed, unspecified: Secondary | ICD-10-CM

## 2018-05-02 DIAGNOSIS — F1220 Cannabis dependence, uncomplicated: Secondary | ICD-10-CM | POA: Diagnosis not present

## 2018-05-02 DIAGNOSIS — F4310 Post-traumatic stress disorder, unspecified: Secondary | ICD-10-CM

## 2018-05-02 DIAGNOSIS — F17200 Nicotine dependence, unspecified, uncomplicated: Secondary | ICD-10-CM

## 2018-05-02 DIAGNOSIS — F5105 Insomnia due to other mental disorder: Secondary | ICD-10-CM

## 2018-05-02 NOTE — Progress Notes (Signed)
BH MD OP Progress Note  05/02/2018 12:41 PM Hector George  MRN:  [id number]  Chief Complaint: ' I am here for follow up.' Chief Complaint    Follow-up; Medication Refill     HPI: Hector George is a 68 year old Caucasian male, employed, married, lives in [place], has a history of bipolar disorder, PTSD, insomnia, alcohol use disorder in remission, cannabis use disorder, tobacco use disorder, chronic pain, recent inguinal hernia surgery, presented to clinic today for a follow-up visit.  Patient today reports he is making progress on the current medication regimen.  Does report some dizziness from the prazosin and hence stopped taking it.  Patient denies any suicidality, homicidality or perceptual disturbances.  He reports sleep is improved.  Patient was provided lab slip to get Depakote levels last visit however he has not been able to get it done yet.  Discussed with patient to get it done and he agrees with plan.  Patient reports psychotherapy sessions is going well.   Visit Diagnosis:    ICD-10-CM   1. Bipolar I disorder, most recent episode mixed (HCC) F31.60   2. PTSD (post-traumatic stress disorder) F43.10   3. Insomnia due to mental condition F51.05   4. Cannabis use disorder, moderate, dependence (HCC) F12.20   5. Alcohol use disorder, severe, in sustained remission (HCC) F10.21   6. Tobacco use disorder F17.200     Past Psychiatric History: I have reviewed past psychiatric history from my progress note on 10/24/2017  Past Medical History:  Past Medical History:  Diagnosis Date  . Affective bipolar disorder (HCC)   . Anxiety   . Claudication, class I (HCC)   . Depression   . Left inguinal hernia   . Lumbago   . Nightmares associated with chronic post-traumatic stress disorder   . Osteoarthritis     Past Surgical History:  Procedure Laterality Date  . BACK SURGERY    . CERVICAL SPINE SURGERY    . INGUINAL HERNIA REPAIR Left 02/13/2018   Procedure: HERNIA REPAIR INGUINAL  ADULT;  Surgeon: Sung Amabile, DO;  Location: [facility] ORS;  Service: General;  Laterality: Left;  . NOSE SURGERY      Family Psychiatric History: Have reviewed family psychiatric history from my progress note on 10/24/2017  Family History:  Family History  Problem Relation Age of Onset  . Heart disease Mother   . Anxiety disorder Brother   . Depression Brother   . Anxiety disorder Sister   . Depression Sister   . Cancer Neg Hx     Social History: Reviewed social history from my progress note on 10/24/2017 Social History   Socioeconomic History  . Marital status: Married    Spouse name: ruth  . Number of children: 1  . Years of education: Not on file  . Highest education level: Some college, no degree  Occupational History  . Not on file  Social Needs  . Financial resource strain: Hard  . Food insecurity:    Worry: Sometimes true    Inability: Sometimes true  . Transportation needs:    Medical: Yes    Non-medical: Yes  Tobacco Use  . Smoking status: Current Every Day Smoker    Packs/day: 0.25    Types: Cigarettes  . Smokeless tobacco: Never Used  Substance and Sexual Activity  . Alcohol use: No  . Drug use: Yes    Types: Marijuana    Comment: daily  . Sexual activity: Yes  Lifestyle  . Physical activity:    Days per  week: 0 days    Minutes per session: 0 min  . Stress: Very much  Relationships  . Social connections:    Talks on phone: Not on file    Gets together: Not on file    Attends religious service: Never    Active member of club or organization: No    Attends meetings of clubs or organizations: Never    Relationship status: Married  Other Topics Concern  . Not on file  Social History Narrative  . Not on file    Allergies: No Known Allergies  Metabolic Disorder Labs: Lab Results  Component Value Date   HGBA1C 5.6 10/24/2017   Lab Results  Component Value Date   PROLACTIN 5.6 10/24/2017   Lab Results  Component Value Date   CHOL 172  10/24/2017   TRIG 75 10/24/2017   HDL 54 10/24/2017   LDLCALC 103 (H) 10/24/2017   Lab Results  Component Value Date   TSH 0.951 10/24/2017    Therapeutic Level Labs: No results found for: LITHIUM Lab Results  Component Value Date   VALPROATE 22 (L) 01/14/2018   No components found for:  CBMZ  Current Medications: Current Outpatient Medications  Medication Sig Dispense Refill  . divalproex (DEPAKOTE) 500 MG DR tablet Take 1 tablet (500 mg total) by mouth 3 (three) times daily. 270 tablet 0  . docusate sodium (COLACE) 100 MG capsule Take 1 capsule (100 mg total) by mouth 2 (two) times daily. 30 capsule 2  . FLUoxetine (PROZAC) 40 MG capsule Take 1 capsule (40 mg total) by mouth daily. 90 capsule 0  . ibuprofen (ADVIL,MOTRIN) 800 MG tablet Take 1 tablet (800 mg total) by mouth every 8 (eight) hours as needed for mild pain or moderate pain. 30 tablet 0  . OLANZapine (ZYPREXA) 15 MG tablet TAKE 1 TABLET (15 MG TOTAL) BY MOUTH AT BEDTIME. FOR MOOD AND SLEEP 90 tablet 1  . traMADol (ULTRAM) 50 MG tablet Take 1 tablet (50 mg total) by mouth every 6 (six) hours as needed. 20 tablet 0  . traZODone (DESYREL) 50 MG tablet TAKE 1-2 TABLETS (50-100 MG TOTAL) BY MOUTH AT BEDTIME. FOR SLEEP 180 tablet 1   No current facility-administered medications for this visit.      Musculoskeletal: Strength & Muscle Tone: within normal limits Gait & Station: normal Patient leans: N/A  Psychiatric Specialty Exam: Review of Systems  Psychiatric/Behavioral: Negative for depression. The patient is not nervous/anxious.   All other systems reviewed and are negative.   Blood pressure 129/81, pulse 75, temperature 97.9 F (36.6 C), temperature source Oral, weight 141 lb (64 kg).Body mass index is 21.44 kg/m.  General Appearance: Casual  Eye Contact:  Fair  Speech:  Normal Rate  Volume:  Normal  Mood:  Euthymic  Affect:  Congruent  Thought Process:  Goal Directed and Descriptions of Associations:  Intact  Orientation:  Full (Time, Place, and Person)  Thought Content: Logical   Suicidal Thoughts:  No  Homicidal Thoughts:  No  Memory:  Immediate;   Fair Recent;   Fair Remote;   Fair  Judgement:  Fair  Insight:  Fair  Psychomotor Activity:  Normal  Concentration:  Concentration: Fair and Attention Span: Fair  Recall:  [employer]  Fund of Knowledge: Fair  Language: Fair  Akathisia:  No  Handed:  Right  AIMS (if indicated): denies tremors, rigidity,stiffness  Assets:  Communication Skills Desire for Improvement Social Support  ADL's:  Intact  Cognition: WNL  Sleep:  improving   Screenings:   Assessment and Plan: Hector George is a 68 year old Caucasian male, married, employed, lives in [place], has a history of bipolar disorder, PTSD, chronic pain, cannabis use disorder, tobacco use disorder, alcohol use disorder in remission, presented to clinic today for a follow-up visit.  Patient is biologically predisposed given his family history of mental health problems.  Patient also has comorbid substance abuse problems as well as history of trauma.  Patient however is making progress on the current medication regimen.  Plan as noted below.  Plan Bipolar disorder-improving Zyprexa 15 mg p.o. nightly Prozac 40 mg p.o. daily Depakote 500 mg p.o. 3 times daily. We will give lab slip to get Depakote levels done, he was not able to get it done last visit.   PTSD-improving Prozac and Zyprexa as prescribed Discontinue prazosin 1 mg for side effects of dizziness. Trazodone 50 to 100 mg p.o. nightly as needed Continue psychotherapy with Ms. Tasia Catchings.  For insomnia-improving Trazodone as prescribed.  For alcohol use disorder in remission He continues to be sober  For cannabis use disorder-improving Provided substance abuse counseling.  For tobacco use disorder- unstable Viibryd smoking cessation counseling.  Follow-up in clinic in 3 months or sooner if needed.  I have spent atleast 15  minutes face to face with patient today. More than 50 % of the time was spent for psychoeducation and supportive psychotherapy and care coordination.  Marland Kitchentsderag    Jomarie Longs, MD 05/02/2018, 12:41 PM

## 2018-05-03 LAB — VALPROIC ACID LEVEL: Valproic Acid Lvl: 17 ug/mL — ABNORMAL LOW (ref 50–100)

## 2018-05-06 ENCOUNTER — Other Ambulatory Visit: Payer: Self-pay | Admitting: Psychiatry

## 2018-05-06 DIAGNOSIS — F4310 Post-traumatic stress disorder, unspecified: Secondary | ICD-10-CM

## 2018-05-06 DIAGNOSIS — F5105 Insomnia due to other mental disorder: Secondary | ICD-10-CM

## 2018-05-06 DIAGNOSIS — F3160 Bipolar disorder, current episode mixed, unspecified: Secondary | ICD-10-CM

## 2018-05-14 ENCOUNTER — Telehealth: Payer: Self-pay | Admitting: Psychiatry

## 2018-05-14 NOTE — Telephone Encounter (Signed)
Attempted to call patient regarding his depakote level, possible noncompliance. Pt unavailable.

## 2018-05-31 ENCOUNTER — Other Ambulatory Visit: Payer: Self-pay | Admitting: Psychiatry

## 2018-06-25 ENCOUNTER — Other Ambulatory Visit: Payer: Self-pay | Admitting: Psychiatry

## 2018-06-25 DIAGNOSIS — F3160 Bipolar disorder, current episode mixed, unspecified: Secondary | ICD-10-CM

## 2018-07-03 ENCOUNTER — Ambulatory Visit: Payer: Medicare HMO | Admitting: Licensed Clinical Social Worker

## 2018-07-03 ENCOUNTER — Other Ambulatory Visit: Payer: Self-pay

## 2018-07-29 DIAGNOSIS — N401 Enlarged prostate with lower urinary tract symptoms: Secondary | ICD-10-CM | POA: Diagnosis not present

## 2018-07-29 DIAGNOSIS — M5440 Lumbago with sciatica, unspecified side: Secondary | ICD-10-CM | POA: Diagnosis not present

## 2018-07-29 DIAGNOSIS — I739 Peripheral vascular disease, unspecified: Secondary | ICD-10-CM | POA: Diagnosis not present

## 2018-07-29 DIAGNOSIS — Z0000 Encounter for general adult medical examination without abnormal findings: Secondary | ICD-10-CM | POA: Diagnosis not present

## 2018-07-29 DIAGNOSIS — J449 Chronic obstructive pulmonary disease, unspecified: Secondary | ICD-10-CM | POA: Diagnosis not present

## 2018-07-30 ENCOUNTER — Other Ambulatory Visit: Payer: Self-pay | Admitting: Psychiatry

## 2018-07-30 DIAGNOSIS — F3160 Bipolar disorder, current episode mixed, unspecified: Secondary | ICD-10-CM

## 2018-07-30 DIAGNOSIS — F4310 Post-traumatic stress disorder, unspecified: Secondary | ICD-10-CM

## 2018-07-30 DIAGNOSIS — F5105 Insomnia due to other mental disorder: Secondary | ICD-10-CM

## 2018-08-01 ENCOUNTER — Encounter: Payer: Self-pay | Admitting: Psychiatry

## 2018-08-01 ENCOUNTER — Ambulatory Visit (INDEPENDENT_AMBULATORY_CARE_PROVIDER_SITE_OTHER): Payer: Medicare HMO | Admitting: Psychiatry

## 2018-08-01 ENCOUNTER — Other Ambulatory Visit: Payer: Self-pay

## 2018-08-01 DIAGNOSIS — F1220 Cannabis dependence, uncomplicated: Secondary | ICD-10-CM | POA: Diagnosis not present

## 2018-08-01 DIAGNOSIS — F4310 Post-traumatic stress disorder, unspecified: Secondary | ICD-10-CM

## 2018-08-01 DIAGNOSIS — F1021 Alcohol dependence, in remission: Secondary | ICD-10-CM

## 2018-08-01 DIAGNOSIS — F3160 Bipolar disorder, current episode mixed, unspecified: Secondary | ICD-10-CM | POA: Diagnosis not present

## 2018-08-01 DIAGNOSIS — F5105 Insomnia due to other mental disorder: Secondary | ICD-10-CM

## 2018-08-01 MED ORDER — FLUOXETINE HCL 40 MG PO CAPS: 40.0000 mg | ORAL_CAPSULE | Freq: Every day | ORAL | 1 refills | Status: AC

## 2018-08-01 MED ORDER — TRAZODONE HCL 50 MG PO TABS: 50.0000 mg | ORAL_TABLET | Freq: Every day | ORAL | 1 refills | Status: AC

## 2018-08-01 MED ORDER — DIVALPROEX SODIUM ER 500 MG PO TB24: 1000.0000 mg | ORAL_TABLET | Freq: Every day | ORAL | 1 refills | Status: DC

## 2018-08-01 NOTE — Progress Notes (Signed)
Virtual Visit via Telephone Note  I connected with Hector George on 08/01/18 at 10:30 AM EDT by telephone and verified that I am speaking with the correct person using two identifiers.   I discussed the limitations, risks, security and privacy concerns of performing an evaluation and management service by telephone and the availability of in person appointments. I also discussed with the patient that there may be a patient responsible charge related to this service. The patient expressed understanding and agreed to proceed.   I discussed the assessment and treatment plan with the patient. The patient was provided an opportunity to ask questions and all were answered. The patient agreed with the plan and demonstrated an understanding of the instructions.   The patient was advised to call back or seek an in-person evaluation if the symptoms worsen or if the condition fails to improve as anticipated.  BH MD OP Progress Note  08/01/2018 12:08 PM Hector DusDanny Lipsky  MRN:  [id number]  Chief Complaint:  Chief Complaint    Follow-up     HPI: Hector George is a 68 year old Caucasian male, employed, married, lives in [place], has a history of bipolar disorder, PTSD, insomnia, alcohol use disorder in remission, cannabis use disorder, tobacco use disorder, chronic pain, recent inguinal hernia surgery was evaluated by phone today.  Patient was offered video called however declined.  Patient reports he is currently struggling with mood lability, irritability and anger issues.  He reports he is frustrated about the different things that are going on.  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  Patient reports he has not been very compliant with his Depakote.  Patient reports that's likely due to having to take it several times a day.  Patient had Depakote level done in March which was very low however today patient reports that he was not compliant prior  to going for the levels.  Patient had told writer previously that he was compliant with his medications.  Discussed with patient that his Depakote can be changed to a one-time dosage.  He agrees with plan.  Patient reports he continues to take Zyprexa at bedtime and it helps him to sleep.  He denies any perceptual disturbances.  He denies any suicidality or homicidality.  He continues to stay away from alcohol.  He reports he has been cutting back on the cannabis.  He has good social support system from his wife.  He denies any other concerns today. Visit Diagnosis:    ICD-10-CM   1. Bipolar I disorder, most recent episode mixed (HCC) F31.60 divalproex (DEPAKOTE ER) 500 MG 24 hr tablet    FLUoxetine (PROZAC) 40 MG capsule    traZODone (DESYREL) 50 MG tablet  2. PTSD (post-traumatic stress disorder) F43.10 traZODone (DESYREL) 50 MG tablet  3. Insomnia due to mental condition F51.05 traZODone (DESYREL) 50 MG tablet  4. Cannabis use disorder, moderate, dependence (HCC) F12.20   5. Alcohol use disorder, severe, in sustained remission (HCC) F10.21     Past Psychiatric History: I have reviewed past psychiatric history from my progress note on 69/29/2019.  Past Medical History:  Past Medical History:  Diagnosis Date  . Affective bipolar disorder (HCC)   . Anxiety   . Claudication, class I (HCC)   . Depression   . Left inguinal hernia   . Lumbago   . Nightmares associated with chronic post-traumatic stress disorder   . Osteoarthritis     Past Surgical History:  Procedure  Laterality Date  . BACK SURGERY    . CERVICAL SPINE SURGERY    . INGUINAL HERNIA REPAIR Left 02/13/2018   Procedure: HERNIA REPAIR INGUINAL ADULT;  Surgeon: Sung Amabile, DO;  Location: [facility] ORS;  Service: General;  Laterality: Left;  . NOSE SURGERY      Family Psychiatric History: I have reviewed family psychiatric history from my progress note on 69/29/2019.  Family History:  Family History  Problem  Relation Age of Onset  . Heart disease Mother   . Anxiety disorder Brother   . Depression Brother   . Anxiety disorder Sister   . Depression Sister   . Cancer Neg Hx     Social History: I have reviewed social history from my progress note on 69/29/2019. Social History   Socioeconomic History  . Marital status: Married    Spouse name: ruth  . Number of children: 1  . Years of education: Not on file  . Highest education level: Some college, no degree  Occupational History  . Not on file  Social Needs  . Financial resource strain: Hard  . Food insecurity:    Worry: Sometimes true    Inability: Sometimes true  . Transportation needs:    Medical: Yes    Non-medical: Yes  Tobacco Use  . Smoking status: Current Every Day Smoker    Packs/day: 0.25    Types: Cigarettes  . Smokeless tobacco: Never Used  Substance and Sexual Activity  . Alcohol use: No  . Drug use: Yes    Types: Marijuana    Comment: daily  . Sexual activity: Yes  Lifestyle  . Physical activity:    Days per week: 0 days    Minutes per session: 0 min  . Stress: Very much  Relationships  . Social connections:    Talks on phone: Not on file    Gets together: Not on file    Attends religious service: Never    Active member of club or organization: No    Attends meetings of clubs or organizations: Never    Relationship status: Married  Other Topics Concern  . Not on file  Social History Narrative  . Not on file    Allergies: No Known Allergies  Metabolic Disorder Labs: Lab Results  Component Value Date   HGBA1C 5.6 10/24/2017   Lab Results  Component Value Date   PROLACTIN 5.6 10/24/2017   Lab Results  Component Value Date   CHOL 172 10/24/2017   TRIG 75 10/24/2017   HDL 54 10/24/2017   LDLCALC 103 (H) 10/24/2017   Lab Results  Component Value Date   TSH 0.951 10/24/2017    Therapeutic Level Labs: No results found for: LITHIUM Lab Results  Component Value Date   VALPROATE 17 (L)  05/02/2018   VALPROATE 22 (L) 01/14/2018   No components found for:  CBMZ  Current Medications: Current Outpatient Medications  Medication Sig Dispense Refill  . divalproex (DEPAKOTE ER) 500 MG 24 hr tablet Take 2 tablets (1,000 mg total) by mouth daily with supper. 90 tablet 1  . docusate sodium (COLACE) 100 MG capsule Take 1 capsule (100 mg total) by mouth 2 (two) times daily. 30 capsule 2  . FLUoxetine (PROZAC) 40 MG capsule Take 1 capsule (40 mg total) by mouth daily. 90 capsule 1  . ibuprofen (ADVIL,MOTRIN) 800 MG tablet Take 1 tablet (800 mg total) by mouth every 8 (eight) hours as needed for mild pain or moderate pain. 30 tablet 0  .  OLANZapine (ZYPREXA) 15 MG tablet TAKE 1 TABLET (15 MG TOTAL) BY MOUTH AT BEDTIME. FOR MOOD AND SLEEP 90 tablet 1  . traMADol (ULTRAM) 50 MG tablet Take 1 tablet (50 mg total) by mouth every 6 (six) hours as needed. 20 tablet 0  . traZODone (DESYREL) 50 MG tablet Take 1-2 tablets (50-100 mg total) by mouth at bedtime. For sleep 180 tablet 1   No current facility-administered medications for this visit.      Musculoskeletal: Strength & Muscle Tone: reports as WNL Gait & Station: reports as WNL Patient leans: N/A  Psychiatric Specialty Exam: Review of Systems  Psychiatric/Behavioral: The patient is nervous/anxious.   All other systems reviewed and are negative.   There were no vitals taken for this visit.There is no height or weight on file to calculate BMI.  General Appearance: UTA  Eye Contact:  UTA  Speech:  Clear and Coherent  Volume:  Normal  Mood:  Angry and Anxious  Affect:  UTA  Thought Process:  Goal Directed and Descriptions of Associations: Intact  Orientation:  Full (Time, Place, and Person)  Thought Content: Logical   Suicidal Thoughts:  No  Homicidal Thoughts:  No  Memory:  Immediate;   Fair Recent;   Fair Remote;   Fair  Judgement:  Fair  Insight:  Fair  Psychomotor Activity:  Normal  Concentration:  Concentration: Fair  and Attention Span: Fair  Recall:  [employer] of Knowledge: Fair  Language: Fair  Akathisia:  No  Handed:  Right  AIMS (if indicated): Denies tremors, rigidity  Assets:  Communication Skills Desire for Improvement Social Support  ADL's:  Intact  Cognition: WNL  Sleep:  Fair   Screenings:   Assessment and Plan: Jaymen is a 68 year old Caucasian male, married, employed, lives in [place], has a history of bipolar disorder, PTSD, chronic pain, cannabis use disorder, tobacco use disorder, alcohol use disorder in remission was evaluated by phone today.  Patient declined video call.  Patient is biologically predisposed given his family history of mental health problems, he also has psychosocial stressors of current COVID-19 outbreak, financial problems.  Patient will benefit from medication readjustment as well as psychotherapy sessions.  Plan as noted below.  Plan Bipolar disorder-unstable Zyprexa 15 mg p.o. nightly Prozac 40 mg p.o. daily Change Depakote to Depakote ER 1000 mg p.o. nightly with supper. We will mail him lab slip to go to [facility].  Discussed with him to take the medication daily for the next 1 to 2 weeks and go to lab to get Depakote levels done.  His most recent Depakote level on 05/02/2018 was subtherapeutic at 17.  Patient however reports he was noncompliant with his medications.  PTSD-improving Prozac and Zyprexa as prescribed Trazodone 50 to 100 mg p.o. nightly as needed Continue psychotherapy sessions with Ms. Tasia Catchings.  For insomnia-improving Trazodone as prescribed  For alcohol use disorder in remission He continues to be sober.  For cannabis use disorder-improving Provided substance abuse counseling.  For tobacco use disorder- unstable Provided smoking cessation counseling.  Follow-up in clinic in 3 to 4 weeks or sooner if needed.  July 2 at 8:45 in the morning.  I have spent atleast 25 minutes non face to face with patient today. More than 50 % of the  time was spent for psychoeducation and supportive psychotherapy and care coordination.  This note was generated in part or whole with voice recognition software. Voice recognition is usually quite accurate but there are transcription errors that can  and very often do occur. I apologize for any typographical errors that were not detected and corrected.          Jomarie Longs, MD 08/01/2018, 12:08 PM

## 2018-08-28 ENCOUNTER — Ambulatory Visit: Payer: Medicare HMO | Admitting: Psychiatry

## 2018-08-28 ENCOUNTER — Other Ambulatory Visit: Payer: Self-pay

## 2018-09-26 ENCOUNTER — Other Ambulatory Visit: Payer: Self-pay | Admitting: Psychiatry

## 2018-12-05 ENCOUNTER — Telehealth: Payer: Self-pay

## 2018-12-05 DIAGNOSIS — F3160 Bipolar disorder, current episode mixed, unspecified: Secondary | ICD-10-CM

## 2018-12-05 MED ORDER — OLANZAPINE 15 MG PO TABS: 15.0000 mg | ORAL_TABLET | Freq: Every day | ORAL | 1 refills | Status: AC

## 2018-12-05 NOTE — Telephone Encounter (Signed)
pt called left a message that he needed refills on his medications

## 2018-12-05 NOTE — Telephone Encounter (Signed)
Zyprexa sent to pharmacy.Patient needs appt.

## 2018-12-05 NOTE — Telephone Encounter (Signed)
pt was called and left a message to please call office to make an appt.

## 2018-12-09 ENCOUNTER — Telehealth: Payer: Self-pay | Admitting: Psychiatry

## 2018-12-09 NOTE — Telephone Encounter (Signed)
Returned call to [facility] Left message to call back.

## 2018-12-10 ENCOUNTER — Telehealth: Payer: Self-pay

## 2018-12-10 ENCOUNTER — Encounter: Payer: Self-pay | Admitting: Psychiatry

## 2018-12-10 ENCOUNTER — Ambulatory Visit (INDEPENDENT_AMBULATORY_CARE_PROVIDER_SITE_OTHER): Payer: Medicare HMO | Admitting: Psychiatry

## 2018-12-10 ENCOUNTER — Other Ambulatory Visit: Payer: Self-pay

## 2018-12-10 DIAGNOSIS — F1220 Cannabis dependence, uncomplicated: Secondary | ICD-10-CM | POA: Diagnosis not present

## 2018-12-10 DIAGNOSIS — F4310 Post-traumatic stress disorder, unspecified: Secondary | ICD-10-CM | POA: Diagnosis not present

## 2018-12-10 DIAGNOSIS — F5105 Insomnia due to other mental disorder: Secondary | ICD-10-CM

## 2018-12-10 DIAGNOSIS — F1021 Alcohol dependence, in remission: Secondary | ICD-10-CM

## 2018-12-10 DIAGNOSIS — F3160 Bipolar disorder, current episode mixed, unspecified: Secondary | ICD-10-CM | POA: Insufficient documentation

## 2018-12-10 DIAGNOSIS — Z91199 Patient's noncompliance with other medical treatment and regimen due to unspecified reason: Secondary | ICD-10-CM

## 2018-12-10 DIAGNOSIS — F17200 Nicotine dependence, unspecified, uncomplicated: Secondary | ICD-10-CM | POA: Diagnosis not present

## 2018-12-10 DIAGNOSIS — Z79899 Other long term (current) drug therapy: Secondary | ICD-10-CM | POA: Diagnosis not present

## 2018-12-10 DIAGNOSIS — Z9119 Patient's noncompliance with other medical treatment and regimen: Secondary | ICD-10-CM | POA: Diagnosis not present

## 2018-12-10 MED ORDER — DIVALPROEX SODIUM ER 500 MG PO TB24: 1000.0000 mg | ORAL_TABLET | Freq: Every day | ORAL | 1 refills | Status: AC

## 2018-12-10 NOTE — Telephone Encounter (Signed)
called left message to make appt

## 2018-12-10 NOTE — Telephone Encounter (Signed)
lab work orders mailed out  

## 2018-12-10 NOTE — Telephone Encounter (Signed)
pt called back and set up an appt for today at 11:15

## 2018-12-10 NOTE — Progress Notes (Signed)
Virtual Visit via Telephone Note  I connected with Hector George on 12/10/18 at 11:15 AM EDT by telephone and verified that I am speaking with the correct person using two identifiers.   I discussed the limitations, risks, security and privacy concerns of performing an evaluation and management service by telephone and the availability of in person appointments. I also discussed with the patient that there may be a patient responsible charge related to this service. The patient expressed understanding and agreed to proceed.    I discussed the assessment and treatment plan with the patient. The patient was provided an opportunity to ask questions and all were answered. The patient agreed with the plan and demonstrated an understanding of the instructions.   The patient was advised to call back or seek an in-person evaluation if the symptoms worsen or if the condition fails to improve as anticipated.   BH MD OP Progress Note  12/10/2018 12:17 PM Hector George  MRN:  [id number]  Chief Complaint:  Chief Complaint    Follow-up     HPI: Hector George is a 68 year old Caucasian male, employed, married, lives in [place], has a history of bipolar disorder, PTSD, insomnia, alcohol use disorder in remission, cannabis use disorder, tobacco use disorder, chronic pain, recent inguinal hernia surgery was evaluated by phone today.  Patient preferred to do a phone call.  Patient was last seen on 08/01/2018.  At that visit patient was advised to return in 4 weeks and also an appointment was given for July 2.  Patient however returns today reporting that he has been noncompliant with his medications.  He reports that because of the pandemic he just stayed home and did not want to go out.  This made him more depressed.  He reports the last time he may have taken his medications was 3 to 4 weeks ago.  He reports he cannot find his Depakote pill bottle and does not know what happened to it.  He  reports he may have refills on  his other medications.  He agrees to call pharmacy to request a refill today.  Patient reports he was struggling with significant depressive symptoms few weeks ago however currently he may be better than that.  He however continues to struggle with some sadness, mood lability ,irritability and sleep problems.  He reports he has nightmares at night which are usually regarding his previous job problems.  Patient denies any suicidality, homicidality or perceptual disturbances.  He reports he continues to smoke cannabis-4-5 blunts per week.  He continues to stay sober from alcohol.  Discussed with patient regarding message received from his PCP at [facility]- provider Kathryne Sharper at [phone].  Discussed with patient that per his provider he may benefit from services at the [place].  Patient reports he was also advised by his primary care provider to start mental health services with [facility] [place].  [facility] reports [facility] which will help him to get there.  Discussed with patient to make use of the services.  He is agreeable to transitioning his care to them.    Visit Diagnosis:    ICD-10-CM   1. Bipolar I disorder, most recent episode mixed (HCC)  F31.60 divalproex (DEPAKOTE ER) 500 MG 24 hr tablet   mild  2. PTSD (post-traumatic stress disorder)  F43.10   3. Insomnia due to mental condition  F51.05   4. Cannabis use disorder, moderate, dependence (HCC)  F12.20   5. Alcohol use disorder, severe, in sustained remission (HCC)  F10.21   6. Tobacco use disorder  F17.200   7. High risk medication use  Z79.899 Valproic acid level    CBC With Differential    CMP and Liver  8. Noncompliance with treatment regimen  Z91.19     Past Psychiatric History: I have reviewed past psychiatric history from my progress note on 10/24/2017.  Past Medical History:  Past Medical History:  Diagnosis Date  . Affective bipolar disorder ([place])   . Anxiety   . Claudication, class I ([place])   . Depression   .  Left inguinal hernia   . Lumbago   . Nightmares associated with chronic post-traumatic stress disorder   . Osteoarthritis     Past Surgical History:  Procedure Laterality Date  . BACK SURGERY    . CERVICAL SPINE SURGERY    . INGUINAL HERNIA REPAIR Left 02/13/2018   Procedure: HERNIA REPAIR INGUINAL ADULT;  Surgeon: Benjamine Sprague, DO;  Location: [facility] ORS;  Service: General;  Laterality: Left;  . NOSE SURGERY      Family Psychiatric History: Reviewed family psychiatric history from my progress note on 10/24/2017.  Family History:  Family History  Problem Relation Age of Onset  . Heart disease Mother   . Anxiety disorder Brother   . Depression Brother   . Anxiety disorder Sister   . Depression Sister   . Cancer Neg Hx     Social History: I have reviewed social history from my progress note on 10/24/2017. Social History   Socioeconomic History  . Marital status: Married    Spouse name: ruth  . Number of children: 1  . Years of education: Not on file  . Highest education level: Some college, no degree  Occupational History  . Not on file  Social Needs  . Financial resource strain: Hard  . Food insecurity    Worry: Sometimes true    Inability: Sometimes true  . Transportation needs    Medical: Yes    Non-medical: Yes  Tobacco Use  . Smoking status: Current Every Day Smoker    Packs/day: 0.25    Types: Cigarettes  . Smokeless tobacco: Never Used  Substance and Sexual Activity  . Alcohol use: No  . Drug use: Yes    Types: Marijuana    Comment: daily  . Sexual activity: Yes  Lifestyle  . Physical activity    Days per week: 0 days    Minutes per session: 0 min  . Stress: Very much  Relationships  . Social Herbalist on phone: Not on file    Gets together: Not on file    Attends religious service: Never    Active member of club or organization: No    Attends meetings of clubs or organizations: Never    Relationship status: Married  Other Topics  Concern  . Not on file  Social History Narrative  . Not on file    Allergies: No Known Allergies  Metabolic Disorder Labs: Lab Results  Component Value Date   HGBA1C 5.6 10/24/2017   Lab Results  Component Value Date   PROLACTIN 5.6 10/24/2017   Lab Results  Component Value Date   CHOL 172 10/24/2017   TRIG 75 10/24/2017   HDL 54 10/24/2017   LDLCALC 103 (H) 10/24/2017   Lab Results  Component Value Date   TSH 0.951 10/24/2017    Therapeutic Level Labs: No results found for: LITHIUM Lab Results  Component Value Date   VALPROATE 17 (L) 05/02/2018  VALPROATE 22 (L) 01/14/2018   No components found for:  CBMZ  Current Medications: Current Outpatient Medications  Medication Sig Dispense Refill  . divalproex (DEPAKOTE ER) 500 MG 24 hr tablet Take 2 tablets (1,000 mg total) by mouth daily with supper. 180 tablet 1  . docusate sodium (COLACE) 100 MG capsule Take 1 capsule (100 mg total) by mouth 2 (two) times daily. 30 capsule 2  . FLUoxetine (PROZAC) 40 MG capsule Take 1 capsule (40 mg total) by mouth daily. 90 capsule 1  . ibuprofen (ADVIL,MOTRIN) 800 MG tablet Take 1 tablet (800 mg total) by mouth every 8 (eight) hours as needed for mild pain or moderate pain. 30 tablet 0  . OLANZapine (ZYPREXA) 15 MG tablet Take 1 tablet (15 mg total) by mouth at bedtime. For mood and sleep 90 tablet 1  . traMADol (ULTRAM) 50 MG tablet Take 1 tablet (50 mg total) by mouth every 6 (six) hours as needed. 20 tablet 0  . traZODone (DESYREL) 50 MG tablet Take 1-2 tablets (50-100 mg total) by mouth at bedtime. For sleep 180 tablet 1   No current facility-administered medications for this visit.      Musculoskeletal: Strength & Muscle Tone: UTA Gait & Station: Reports as WNL  Patient leans: N/A  Psychiatric Specialty Exam: Review of Systems  Psychiatric/Behavioral: Positive for depression and substance abuse. The patient is nervous/anxious and has insomnia.   All other systems  reviewed and are negative.   There were no vitals taken for this visit.There is no height or weight on file to calculate BMI.  General Appearance: UTA  Eye Contact:  UTA  Speech:  Clear and Coherent  Volume:  Normal  Mood:  Anxious and Depressed  Affect:  UTA  Thought Process:  Goal Directed and Descriptions of Associations: Intact  Orientation:  Full (Time, Place, and Person)  Thought Content: Logical   Suicidal Thoughts:  No  Homicidal Thoughts:  No  Memory:  Immediate;   Fair Recent;   Fair Remote;   Fair  Judgement:  Fair  Insight:  Fair  Psychomotor Activity:  UTA  Concentration:  Concentration: Fair and Attention Span: Fair  Recall:  [employer] of Knowledge: Fair  Language: Fair  Akathisia:  No  Handed:  Right  AIMS (if indicated): denies tremors, rigidity  Assets:  Communication Skills Desire for Improvement Housing Social Support  ADL's:  Intact  Cognition: WNL  Sleep:  Restless   Screenings:   Assessment and Plan: Ryken is a 68 year old Caucasian male, married, employed, lives in [place], has a history of bipolar disorder, PTSD, chronic pain, cannabis use disorder, tobacco use disorder, alcohol use disorder in remission was evaluated by phone today.  Patient is biologically predisposed given his family history of mental health problems, he also has psychosocial stressors of current COVID-19 outbreak.  Patient has been noncompliant with medication recommendations as well as follow-ups.  Patient was called by our staff yesterday and hence was evaluated today by phone.  Patient preferred to do a phone call.  Plan Bipolar disorder-unstable Restart Zyprexa 15 mg p.o. nightly.  Discussed with patient that new prescription was sent to his pharmacy a week ago.  He agrees to call the pharmacy. Restart Prozac 40 mg p.o. daily Restart Depakote ER 1000 mg p.o. nightly with supper A lab slip was mailed to patient in June however he has been noncompliant with Depakote  levels.  Will mail lab slip again today.  Order Depakote level, CBC and CMP  today.   PTSD- unstable Prozac and Zyprexa as prescribed Trazodone 50 to 100 mg p.o. nightly as needed Patient was referred for psychotherapy sessions with Ms. Tasia CatchingsCraig in the clinic previously.  However he has been noncompliant with visits.  Insomnia-unstable Restart trazodone 50 to 100 mg p.o. nightly as needed  Alcohol use disorder in remission He continues to stay sober  Cannabis use disorder-unstable Provided substance abuse counseling.  Tobacco use disorder-unstable Provided smoking cessation counseling.  Noncompliance with treatment recommendation-provided education, encouraged compliance.  [facility]  However advised him to get his labs done in a week after starting the Depakote.  Discussed with patient to call back our clinic as needed.  I have spent atleast 15 minutes non  face to face with patient today. More than 50 % of the time was spent for psychoeducation and supportive psychotherapy and care coordination. This note was generated in part or whole with voice recognition software. Voice recognition is usually quite accurate but there are transcription errors that can and very often do occur. I apologize for any typographical errors that were not detected and corrected.      Jomarie LongsSaramma Leauna Sharber, MD 12/10/2018, 12:17 PM

## 2019-01-30 ENCOUNTER — Emergency Department: Payer: Medicare HMO

## 2019-01-30 ENCOUNTER — Other Ambulatory Visit: Payer: Self-pay

## 2019-01-30 ENCOUNTER — Encounter: Payer: Self-pay | Admitting: Emergency Medicine

## 2019-01-30 ENCOUNTER — Emergency Department
Admission: EM | Admit: 2019-01-30 | Discharge: 2019-01-30 | Disposition: A | Discharge status: Home / Self Care | Payer: Medicare HMO | Attending: Emergency Medicine | Admitting: Emergency Medicine

## 2019-01-30 DIAGNOSIS — R42 Dizziness and giddiness: Secondary | ICD-10-CM | POA: Diagnosis not present

## 2019-01-30 DIAGNOSIS — Z79899 Other long term (current) drug therapy: Secondary | ICD-10-CM | POA: Insufficient documentation

## 2019-01-30 DIAGNOSIS — F1210 Cannabis abuse, uncomplicated: Secondary | ICD-10-CM | POA: Insufficient documentation

## 2019-01-30 DIAGNOSIS — R0902 Hypoxemia: Secondary | ICD-10-CM | POA: Diagnosis not present

## 2019-01-30 DIAGNOSIS — I10 Essential (primary) hypertension: Secondary | ICD-10-CM | POA: Diagnosis not present

## 2019-01-30 DIAGNOSIS — F17210 Nicotine dependence, cigarettes, uncomplicated: Secondary | ICD-10-CM | POA: Diagnosis not present

## 2019-01-30 DIAGNOSIS — R5381 Other malaise: Secondary | ICD-10-CM | POA: Diagnosis not present

## 2019-01-30 DIAGNOSIS — R410 Disorientation, unspecified: Secondary | ICD-10-CM | POA: Diagnosis not present

## 2019-01-30 LAB — DIFFERENTIAL
Abs Immature Granulocytes: 0.01 10*3/uL (ref 0.00–0.07)
Basophils Absolute: 0 10*3/uL (ref 0.0–0.1)
Basophils Relative: 0 %
Eosinophils Absolute: 0.1 10*3/uL (ref 0.0–0.5)
Eosinophils Relative: 3 %
Immature Granulocytes: 0 %
Lymphocytes Relative: 40 %
Lymphs Abs: 2.1 10*3/uL (ref 0.7–4.0)
Monocytes Absolute: 0.4 10*3/uL (ref 0.1–1.0)
Monocytes Relative: 8 %
Neutro Abs: 2.6 10*3/uL (ref 1.7–7.7)
Neutrophils Relative %: 49 %

## 2019-01-30 LAB — PROTIME-INR
INR: 1 (ref 0.8–1.2)
Prothrombin Time: 13.2 seconds (ref 11.4–15.2)

## 2019-01-30 LAB — COMPREHENSIVE METABOLIC PANEL
ALT: 8 U/L (ref 0–44)
AST: 11 U/L — ABNORMAL LOW (ref 15–41)
Albumin: 4 g/dL (ref 3.5–5.0)
Alkaline Phosphatase: 56 U/L (ref 38–126)
Anion gap: 8 (ref 5–15)
BUN: 15 mg/dL (ref 8–23)
CO2: 27 mmol/L (ref 22–32)
Calcium: 8.9 mg/dL (ref 8.9–10.3)
Chloride: 107 mmol/L (ref 98–111)
Creatinine, Ser: 0.76 mg/dL (ref 0.61–1.24)
GFR calc Af Amer: >60 mL/min (ref >60)
GFR calc non Af Amer: >60 mL/min (ref >60)
Glucose, Bld: 95 mg/dL (ref 70–99)
Potassium: 3.9 mmol/L (ref 3.5–5.1)
Sodium: 142 mmol/L (ref 135–145)
Total Bilirubin: 0.5 mg/dL (ref 0.3–1.2)
Total Protein: 6.8 g/dL (ref 6.5–8.1)

## 2019-01-30 LAB — CBC
HCT: 38.7 % — ABNORMAL LOW (ref 39.0–52.0)
Hemoglobin: 13.2 g/dL (ref 13.0–17.0)
MCH: 29.7 pg (ref 26.0–34.0)
MCHC: 34.1 g/dL (ref 30.0–36.0)
MCV: 87 fL (ref 80.0–100.0)
Platelets: 188 10*3/uL (ref 150–400)
RBC: 4.45 MIL/uL (ref 4.22–5.81)
RDW: 14.6 % (ref 11.5–15.5)
WBC: 5.2 10*3/uL (ref 4.0–10.5)
nRBC: 0 % (ref 0.0–0.2)

## 2019-01-30 LAB — APTT: aPTT: 29 seconds (ref 24–36)

## 2019-01-30 LAB — GLUCOSE, CAPILLARY: Glucose-Capillary: 89 mg/dL (ref 70–99)

## 2019-01-30 MED ORDER — MECLIZINE HCL 25 MG PO TABS: 25.0000 mg | ORAL_TABLET | Freq: Three times a day (TID) | ORAL | 1 refills | Status: AC | PRN

## 2019-01-30 NOTE — ED Triage Notes (Signed)
Pt in via [facility] from home with complaints of dizziness with any change in position, reports onset of dizziness yesterday approximately 1600.  Pt otherwise neurologically intact, NAD noted at this time.

## 2019-01-30 NOTE — ED Notes (Signed)
Patient transported to CT 

## 2019-01-30 NOTE — ED Notes (Signed)
Pt st "20 falls" since yesterday. St dizzy spells started yesterday "felt dizzy at 3pm" yesterday, pt laying in bed and when attempting to stand up he felt dizzy "felt like somebody was pushing me and couldn't slow down until I hit the wall". Pt denies hitting head/LOC. Pt denies blurry vision, HA, numbness, weakness at this time. Pt denies pain at this time. Pt denies having these dizzy spells in the past.

## 2019-01-30 NOTE — ED Provider Notes (Signed)
[facility] Emergency Department Provider Note  ____________________________________________  Time seen: Approximately 2:28 PM  I have reviewed the triage vital signs and the nursing notes.   HISTORY  Chief Complaint Dizziness    HPI Hector George is a 68 y.o. male with history of bipolar disorder, alcohol abuse, tobacco use who comes the ED complaining of dizziness that occurs with change in position particularly standing up.  Ongoing since yesterday afternoon, intermittent.  Denies any vision changes, paresthesias, or for motor weakness.  No head trauma.  He has had episodes like this in the past but this episode feels more severe.  States that he had 4 falls since waking up this morning.    Denies any recent illness or sick contacts.  Canceled his Thanksgiving plans.  No chest pain or palpitations or headache.  No neck stiffness or fever   Past Medical History:  Diagnosis Date  . Affective bipolar disorder (HCC)   . Anxiety   . Claudication, class I (HCC)   . Depression   . Left inguinal hernia   . Lumbago   . Nightmares associated with chronic post-traumatic stress disorder   . Osteoarthritis      Patient Active Problem List   Diagnosis Date Noted  . Bipolar I disorder, most recent episode mixed (HCC) 12/10/2018  . PTSD (post-traumatic stress disorder) 12/10/2018  . Insomnia due to mental condition 12/10/2018  . Cannabis use disorder, moderate, dependence (HCC) 12/10/2018  . Alcohol use disorder, severe, in sustained remission (HCC) 12/10/2018  . Tobacco use disorder 12/10/2018  . High risk medication use 12/10/2018  . Noncompliance with treatment regimen 12/10/2018  . Affective bipolar disorder (HCC) 10/17/2017  . Arthritis of knee 10/17/2017  . Nightmares associated with chronic post-traumatic stress disorder 10/17/2017  . Neck pain 04/12/2015  . Closed T1 spinal fracture (HCC) 07/31/2014  . Traumatic compression fracture of T3 vertebra  (HCC) 07/31/2014     Past Surgical History:  Procedure Laterality Date  . BACK SURGERY    . CERVICAL SPINE SURGERY    . INGUINAL HERNIA REPAIR Left 02/13/2018   Procedure: HERNIA REPAIR INGUINAL ADULT;  Surgeon: Sung Amabile, DO;  Location: [facility] ORS;  Service: General;  Laterality: Left;  . NOSE SURGERY       Prior to Admission medications   Medication Sig Start Date End Date Taking? Authorizing Provider  divalproex (DEPAKOTE ER) 500 MG 24 hr tablet Take 2 tablets (1,000 mg total) by mouth daily with supper. 12/10/18   Jomarie Longs, MD  FLUoxetine (PROZAC) 40 MG capsule Take 1 capsule (40 mg total) by mouth daily. 08/01/18   Jomarie Longs, MD  ibuprofen (ADVIL,MOTRIN) 800 MG tablet Take 1 tablet (800 mg total) by mouth every 8 (eight) hours as needed for mild pain or moderate pain. 02/13/18   Sung Amabile, DO  meclizine (ANTIVERT) 25 MG tablet Take 1 tablet (25 mg total) by mouth 3 (three) times daily as needed for dizziness or nausea. 01/30/19   Sharman Cheek, MD  OLANZapine (ZYPREXA) 15 MG tablet Take 1 tablet (15 mg total) by mouth at bedtime. For mood and sleep 12/05/18   Jomarie Longs, MD  traZODone (DESYREL) 50 MG tablet Take 1-2 tablets (50-100 mg total) by mouth at bedtime. For sleep 08/01/18   Jomarie Longs, MD     Allergies Patient has no known allergies.   Family History  Problem Relation Age of Onset  . Heart disease Mother   . Anxiety disorder Brother   . Depression  Brother   . Anxiety disorder Sister   . Depression Sister   . Cancer Neg Hx     Social History Social History   Tobacco Use  . Smoking status: Current Every Day Smoker    Packs/day: 0.25    Types: Cigarettes  . Smokeless tobacco: Never Used  Substance Use Topics  . Alcohol use: No  . Drug use: Yes    Types: Marijuana    Comment: daily    Review of Systems  Constitutional:   No fever or chills.  ENT:   No sore throat. No rhinorrhea. Cardiovascular:   No chest pain or  syncope. Respiratory:   No dyspnea or cough. Gastrointestinal:   Negative for abdominal pain, vomiting and diarrhea.  Musculoskeletal:   Negative for focal pain or swelling All other systems reviewed and are negative except as documented above in ROS and HPI.  ____________________________________________   PHYSICAL EXAM:  VITAL SIGNS: ED Triage Vitals  Enc Vitals Group     BP 01/30/19 1302 (!) 152/79     Pulse Rate 01/30/19 1302 61     Resp 01/30/19 1302 18     Temp 01/30/19 1302 98.3 F (36.8 C)     Temp Source 01/30/19 1302 Oral     SpO2 01/30/19 1302 98 %     Weight 01/30/19 1302 125 lb (56.7 kg)     Height 01/30/19 1302 5\' 8"  (1.727 m)     Head Circumference --      Peak Flow --      Pain Score 01/30/19 1311 0     Pain Loc --      Pain Edu? --      Excl. in [place]? --     Vital signs reviewed, nursing assessments reviewed.   Constitutional:   Alert and oriented. Non-toxic appearance. Eyes:   Conjunctivae are normal. EOMI. PERRL. ENT      Head:   Normocephalic and atraumatic.      Nose:   Wearing a mask.      Mouth/Throat:   Wearing a mask.      Neck:   No meningismus. Full ROM. Hematological/Lymphatic/Immunilogical:   No cervical lymphadenopathy. Cardiovascular:   RRR. Symmetric bilateral radial and DP pulses.  No murmurs. Cap refill less than 2 seconds.  No orthostatic changes after brief walk in the room. Respiratory:   Normal respiratory effort without tachypnea/retractions. Breath sounds are clear and equal bilaterally. No wheezes/rales/rhonchi. Gastrointestinal:   Soft and nontender. Non distended. There is no CVA tenderness.  No rebound, rigidity, or guarding. Genitourinary:   deferred Musculoskeletal:   Normal range of motion in all extremities. No joint effusions.  No lower extremity tenderness.  No edema. Neurologic:   Normal speech and language.  Ambulatory with steady gait back-and-forth in the room.  No pronator drift.  Normal finger-to-nose.  No lower  extremity drift. Motor grossly intact. No acute focal neurologic deficits are appreciated.  Skin:    Skin is warm, dry and intact. No rash noted.  No petechiae, purpura, or bullae.  ____________________________________________    LABS (pertinent positives/negatives) (all labs ordered are listed, but only abnormal results are displayed) Labs Reviewed  CBC - Abnormal; Notable for the following components:      Result Value   HCT 38.7 (*)    All other components within normal limits  COMPREHENSIVE METABOLIC PANEL - Abnormal; Notable for the following components:   AST 11 (*)    All other components within normal limits  PROTIME-INR  APTT  DIFFERENTIAL  GLUCOSE, CAPILLARY  CBG MONITORING, ED  I-STAT CREATININE, ED   ____________________________________________   EKG  Interpreted by me  Date: 01/30/2019  Rate: 60  Rhythm: normal sinus rhythm  QRS Axis: normal  Intervals: normal  ST/T Wave abnormalities: normal  Conduction Disutrbances: none  Narrative Interpretation: unremarkable      ____________________________________________    RADIOLOGY  Ct Head Wo Contrast  Result Date: 01/30/2019 CLINICAL DATA:  Dizziness EXAM: CT HEAD WITHOUT CONTRAST TECHNIQUE: Contiguous axial images were obtained from the base of the skull through the vertex without intravenous contrast. COMPARISON:  CT brain 07/30/2014 FINDINGS: Brain: No acute territorial infarction, hemorrhage, or intracranial mass. Atrophy and minimal small vessel ischemic change of the white matter. Enlarged ventricles but grossly stable in size. Vascular: No hyperdense vessels. Scattered calcifications at the carotid siphon Skull: Normal. Negative for fracture or focal lesion. Sinuses/Orbits: Mucosal thickening in the maxillary, ethmoid, and frontal sinuses. Chronic appearing nasal bone deformity Other: None. IMPRESSION: 1. No CT evidence for acute intracranial abnormality. 2. Atrophy and mild small vessel ischemic  changes of the white matter. Electronically Signed   By: Jasmine PangKim  Fujinaga M.D.   On: 01/30/2019 14:15    ____________________________________________   PROCEDURES Procedures  ____________________________________________  DIFFERENTIAL DIAGNOSIS   Vertigo, intracranial hemorrhage  CLINICAL IMPRESSION / ASSESSMENT AND PLAN / ED COURSE  Medications ordered in the ED: Medications - No data to display  Pertinent labs & imaging results that were available during my care of the patient were reviewed by me and considered in my medical decision making (see chart for details).  Hector George was evaluated in Emergency Department on 01/30/2019 for the symptoms described in the history of present illness. He was evaluated in the context of the global COVID-19 pandemic, which necessitated consideration that the patient might be at risk for infection with the SARS-CoV-2 virus that causes COVID-19. Institutional protocols and algorithms that pertain to the evaluation of patients at risk for COVID-19 are in a state of rapid change based on information released by regulatory bodies including the CDC and federal and state organizations. These policies and algorithms were followed during the patient's care in the ED.   Patient presents with episodes of dizziness, worse with change in position.  Neurologically intact currently.  Doubt stroke or TIA.  CT scan is negative for intracranial hemorrhage.  Labs unremarkable.  No evidence of microcytic/nutrient deficient anemia on labs.  Stable for discharge home to follow-up with neurology next week.  Trial of meclizine over the weekend.Considering the patient's symptoms, medical history, and physical examination today, I have low suspicion for ischemic stroke, intracranial hemorrhage, meningitis, encephalitis, carotid or vertebral dissection, venous sinus thrombosis, MS, intracranial hypertension, glaucoma, CRAO, CRVO, or temporal arteritis.        ____________________________________________   FINAL CLINICAL IMPRESSION(S) / ED DIAGNOSES    Final diagnoses:  Dizziness  Vertigo     ED Discharge Orders         Ordered    meclizine (ANTIVERT) 25 MG tablet  3 times daily PRN     01/30/19 1427          Portions of this note were generated with dragon dictation software. Dictation errors may occur despite best attempts at proofreading.   Sharman CheekStafford, Eagan Shifflett, MD 01/30/19 1432

## 2019-01-30 NOTE — ED Triage Notes (Signed)
Pt comes into the ED via EMS from home with c/o dizziness with multiple falls in the past 2 days, denies any injuries, drug or ETOH use/ pt is a/ox4 on arrival. HR 68, 156/89, 98%RA, CBG117, 98.1 temp

## 2019-02-12 ENCOUNTER — Other Ambulatory Visit: Payer: Self-pay | Admitting: Psychiatry

## 2019-02-12 DIAGNOSIS — F5105 Insomnia due to other mental disorder: Secondary | ICD-10-CM

## 2019-02-12 DIAGNOSIS — F3160 Bipolar disorder, current episode mixed, unspecified: Secondary | ICD-10-CM

## 2019-02-12 DIAGNOSIS — F4310 Post-traumatic stress disorder, unspecified: Secondary | ICD-10-CM

## 2019-04-30 ENCOUNTER — Ambulatory Visit: Payer: Medicare HMO | Attending: Internal Medicine

## 2019-04-30 DIAGNOSIS — Z23 Encounter for immunization: Secondary | ICD-10-CM | POA: Insufficient documentation

## 2019-04-30 NOTE — Progress Notes (Signed)
   Covid-19 Vaccination Clinic  Name:  Hector George    MRN: [id number] DOB: 03-07-1950  04/30/2019  Hector George was observed post Covid-19 immunization for 15 minutes without incident. He was provided with Vaccine Information Sheet and instruction to access the V-Safe system.   Hector George was instructed to call 911 with any severe reactions post vaccine: Marland Kitchen Difficulty breathing  . Swelling of face and throat  . A fast heartbeat  . A bad rash all over body  . Dizziness and weakness   Immunizations Administered    Name Date Dose VIS Date Route   Pfizer COVID-19 Vaccine 04/30/2019  8:53 AM 0.3 mL 02/06/2019 Intramuscular   Manufacturer: [employer], [employer]   Lot: [id number]   NDC: [address]-3

## 2019-05-21 ENCOUNTER — Ambulatory Visit: Payer: Medicare HMO | Attending: Internal Medicine

## 2019-05-21 DIAGNOSIS — Z23 Encounter for immunization: Secondary | ICD-10-CM

## 2019-05-21 NOTE — Progress Notes (Signed)
   Covid-19 Vaccination Clinic  Name:  Hector George    MRN: [id number] DOB: 05-07-1950  05/21/2019  [name] was observed post Covid-19 immunization for 15 minutes without incident. He was provided with Vaccine Information Sheet and instruction to access the V-Safe system.   [name] was instructed to call 911 with any severe reactions post vaccine: Marland Kitchen Difficulty breathing  . Swelling of face and throat  . A fast heartbeat  . A bad rash all over body  . Dizziness and weakness   Immunizations Administered    Name Date Dose VIS Date Route   Pfizer COVID-19 Vaccine 05/21/2019  9:58 AM 0.3 mL 02/06/2019 Intramuscular   Manufacturer: [employer], [employer]   Lot: [id number]   NDC: [address]-0

## 2020-05-02 ENCOUNTER — Other Ambulatory Visit: Payer: Self-pay

## 2020-05-02 ENCOUNTER — Emergency Department
Admission: EM | Admit: 2020-05-02 | Discharge: 2020-05-02 | Disposition: A | Discharge status: Home / Self Care | Payer: Medicare Other | Attending: Emergency Medicine | Admitting: Emergency Medicine

## 2020-05-02 ENCOUNTER — Emergency Department: Payer: Medicare Other

## 2020-05-02 ENCOUNTER — Encounter: Payer: Self-pay | Admitting: Emergency Medicine

## 2020-05-02 DIAGNOSIS — R2689 Other abnormalities of gait and mobility: Secondary | ICD-10-CM | POA: Diagnosis not present

## 2020-05-02 DIAGNOSIS — R2681 Unsteadiness on feet: Secondary | ICD-10-CM

## 2020-05-02 DIAGNOSIS — F17210 Nicotine dependence, cigarettes, uncomplicated: Secondary | ICD-10-CM | POA: Insufficient documentation

## 2020-05-02 DIAGNOSIS — M1990 Unspecified osteoarthritis, unspecified site: Secondary | ICD-10-CM | POA: Diagnosis not present

## 2020-05-02 DIAGNOSIS — M79606 Pain in leg, unspecified: Secondary | ICD-10-CM | POA: Diagnosis present

## 2020-05-02 LAB — CBC WITH DIFFERENTIAL/PLATELET
Abs Immature Granulocytes: 0.02 10*3/uL (ref 0.00–0.07)
Basophils Absolute: 0 10*3/uL (ref 0.0–0.1)
Basophils Relative: 0 %
Eosinophils Absolute: 0 10*3/uL (ref 0.0–0.5)
Eosinophils Relative: 1 %
HCT: 38.6 % — ABNORMAL LOW (ref 39.0–52.0)
Hemoglobin: 13 g/dL (ref 13.0–17.0)
Immature Granulocytes: 0 %
Lymphocytes Relative: 44 %
Lymphs Abs: 2.6 10*3/uL (ref 0.7–4.0)
MCH: 29.3 pg (ref 26.0–34.0)
MCHC: 33.7 g/dL (ref 30.0–36.0)
MCV: 87.1 fL (ref 80.0–100.0)
Monocytes Absolute: 0.4 10*3/uL (ref 0.1–1.0)
Monocytes Relative: 6 %
Neutro Abs: 2.9 10*3/uL (ref 1.7–7.7)
Neutrophils Relative %: 49 %
Platelets: 202 10*3/uL (ref 150–400)
RBC: 4.43 MIL/uL (ref 4.22–5.81)
RDW: 13.8 % (ref 11.5–15.5)
WBC: 5.9 10*3/uL (ref 4.0–10.5)
nRBC: 0 % (ref 0.0–0.2)

## 2020-05-02 LAB — COMPREHENSIVE METABOLIC PANEL
ALT: 13 U/L (ref 0–44)
AST: 22 U/L (ref 15–41)
Albumin: 4.1 g/dL (ref 3.5–5.0)
Alkaline Phosphatase: 76 U/L (ref 38–126)
Anion gap: 9 (ref 5–15)
BUN: 24 mg/dL — ABNORMAL HIGH (ref 8–23)
CO2: 26 mmol/L (ref 22–32)
Calcium: 8.9 mg/dL (ref 8.9–10.3)
Chloride: 101 mmol/L (ref 98–111)
Creatinine, Ser: 0.86 mg/dL (ref 0.61–1.24)
GFR, Estimated: >60 mL/min (ref >60)
Glucose, Bld: 77 mg/dL (ref 70–99)
Potassium: 3.9 mmol/L (ref 3.5–5.1)
Sodium: 136 mmol/L (ref 135–145)
Total Bilirubin: 1 mg/dL (ref 0.3–1.2)
Total Protein: 7 g/dL (ref 6.5–8.1)

## 2020-05-02 MED ORDER — ACETAMINOPHEN 500 MG PO TABS
1000.0000 mg | ORAL_TABLET | Freq: Once | ORAL | Status: AC
  Administered 2020-05-02: 1000 mg via ORAL
  Filled 2020-05-02: qty 2

## 2020-05-02 NOTE — ED Triage Notes (Signed)
States PCP referred patient to have MRI and Xray due worsening pain to neck, back, knees, hands.  Looked through Care Everywhere, no notes seen.  AAOx3.  Skin warm and dry. NAD

## 2020-05-02 NOTE — ED Notes (Signed)
EKG GIVEN TO DR Katrinka Blazing

## 2020-05-02 NOTE — ED Provider Notes (Signed)
[facility] Emergency Department Provider Note  ____________________________________________   Event Date/Time   First MD Initiated Contact with Patient 05/02/20 1546     (approximate)  I have reviewed the triage vital signs and the nursing notes.   HISTORY  Chief Complaint No chief complaint on file.   HPI Hector George is a 70 y.o. male with a past medical history of anxiety, depression, bipolar affective disorder and severe osteoarthritis throughout his back in all extremities presents for assessment after being reportedly referred to the ED for evaluation after worsening extremity pain over the last couple of days and some falls.  Patient states he is supposed to use a walker at home but rarely uses this.  He states he thinks he is fallen 3 times over the last couple days.  He is not sure if he hit his head or passed out but does not think he passed out before falling and thinks he has been stumbling due to some pain in weakness in his legs.  He denies any headache, vision changes, vertigo, cough, shortness of breath, chest pain, abdominal pain, nausea, vomiting, diarrhea, dysuria, rash or acute focal extremity weakness.  Seems he has pain in all his joints including his bilateral shoulders, elbows, wrists, hips, knees and ankles and that all of these pains have been getting slightly worse and not responding to ibuprofen but has not changed suddenly today.  He does not think he had a specific joint in his fall the last couple days.  He denies being on any blood thinners.  States he has not had any alcohol in about 10 years.         Past Medical History:  Diagnosis Date  . Affective bipolar disorder (HCC)   . Anxiety   . Claudication, class I (HCC)   . Depression   . Left inguinal hernia   . Lumbago   . Nightmares associated with chronic post-traumatic stress disorder   . Osteoarthritis     Patient Active Problem List   Diagnosis Date Noted  . Bipolar  I disorder, most recent episode mixed (HCC) 12/10/2018  . PTSD (post-traumatic stress disorder) 12/10/2018  . Insomnia due to mental condition 12/10/2018  . Cannabis use disorder, moderate, dependence (HCC) 12/10/2018  . Alcohol use disorder, severe, in sustained remission (HCC) 12/10/2018  . Tobacco use disorder 12/10/2018  . High risk medication use 12/10/2018  . Noncompliance with treatment regimen 12/10/2018  . Affective bipolar disorder (HCC) 10/17/2017  . Arthritis of knee 10/17/2017  . Nightmares associated with chronic post-traumatic stress disorder 10/17/2017  . Neck pain 04/12/2015  . Closed T1 spinal fracture (HCC) 07/31/2014  . Traumatic compression fracture of T3 vertebra (HCC) 07/31/2014    Past Surgical History:  Procedure Laterality Date  . BACK SURGERY    . CERVICAL SPINE SURGERY    . INGUINAL HERNIA REPAIR Left 02/13/2018   Procedure: HERNIA REPAIR INGUINAL ADULT;  Surgeon: Sung Amabile, DO;  Location: [facility] ORS;  Service: General;  Laterality: Left;  . NOSE SURGERY      Prior to Admission medications   Medication Sig Start Date End Date Taking? Authorizing Provider  divalproex (DEPAKOTE ER) 500 MG 24 hr tablet Take 2 tablets (1,000 mg total) by mouth daily with supper. 12/10/18   Jomarie Longs, MD  FLUoxetine (PROZAC) 40 MG capsule Take 1 capsule (40 mg total) by mouth daily. 08/01/18   Jomarie Longs, MD  ibuprofen (ADVIL,MOTRIN) 800 MG tablet Take 1 tablet (800 mg total)  by mouth every 8 (eight) hours as needed for mild pain or moderate pain. 02/13/18   Sung Amabile, DO  meclizine (ANTIVERT) 25 MG tablet Take 1 tablet (25 mg total) by mouth 3 (three) times daily as needed for dizziness or nausea. 01/30/19   Sharman Cheek, MD  OLANZapine (ZYPREXA) 15 MG tablet Take 1 tablet (15 mg total) by mouth at bedtime. For mood and sleep 12/05/18   Jomarie Longs, MD  traZODone (DESYREL) 50 MG tablet Take 1-2 tablets (50-100 mg total) by mouth at bedtime. For sleep 08/01/18    Jomarie Longs, MD    Allergies Patient has no known allergies.  Family History  Problem Relation Age of Onset  . Heart disease Mother   . Anxiety disorder Brother   . Depression Brother   . Anxiety disorder Sister   . Depression Sister   . Cancer Neg Hx     Social History Social History   Tobacco Use  . Smoking status: Current Every Day Smoker    Packs/day: 0.25    Types: Cigarettes  . Smokeless tobacco: Never Used  Vaping Use  . Vaping Use: Never used  Substance Use Topics  . Alcohol use: No  . Drug use: Yes    Types: Marijuana    Comment: daily    Review of Systems  Review of Systems  Constitutional: Negative for chills and fever.  HENT: Negative for sore throat.   Eyes: Negative for pain.  Respiratory: Negative for cough and stridor.   Cardiovascular: Negative for chest pain.  Gastrointestinal: Negative for vomiting.  Musculoskeletal: Positive for back pain ( "everywhere all the time"), falls, joint pain ( "all my joints hurt all the time") and neck pain.  Skin: Negative for rash.  Neurological: Negative for seizures, loss of consciousness and headaches.  Psychiatric/Behavioral: Negative for suicidal ideas.  All other systems reviewed and are negative.     ____________________________________________   PHYSICAL EXAM:  VITAL SIGNS: ED Triage Vitals  Enc Vitals Group     BP 05/02/20 1331 121/89     Pulse Rate 05/02/20 1331 81     Resp 05/02/20 1331 16     Temp 05/02/20 1331 98.5 F (36.9 C)     Temp Source 05/02/20 1331 Oral     SpO2 05/02/20 1331 96 %     Weight 05/02/20 1328 133 lb (60.3 kg)     Height 05/02/20 1328 5\' 8"  (1.727 m)     Head Circumference --      Peak Flow --      Pain Score 05/02/20 1328 10     Pain Loc --      Pain Edu? --      Excl. in GC? --    Vitals:   05/02/20 1331 05/02/20 1601  BP: 121/89 125/74  Pulse: 81 84  Resp: 16 18  Temp: 98.5 F (36.9 C)   SpO2: 96% 98%   Physical Exam Vitals and nursing note  reviewed.  Constitutional:      Appearance: He is well-developed and well-nourished.  HENT:     Head: Normocephalic and atraumatic.     Right Ear: External ear normal.     Left Ear: External ear normal.     Nose: Nose normal.  Eyes:     Conjunctiva/sclera: Conjunctivae normal.  Cardiovascular:     Rate and Rhythm: Normal rate and regular rhythm.     Heart sounds: No murmur heard.   Pulmonary:     Effort: Pulmonary effort  is normal. No respiratory distress.     Breath sounds: Normal breath sounds.  Abdominal:     Palpations: Abdomen is soft.     Tenderness: There is no abdominal tenderness.  Musculoskeletal:        General: No edema.     Cervical back: Neck supple.  Skin:    General: Skin is warm and dry.  Neurological:     Mental Status: He is alert.  Psychiatric:        Mood and Affect: Mood and affect normal.     2+ bilateral radial pulse.  There is no deformity to effusion or point tenderness over the bilateral shoulders, elbows, wrists, knees or ankles.  Patient has some mild tenderness of her C-spine but no point tenderness or step-offs over the T or L-spine.  No obvious trauma to the face scalp head or neck.  Cranial nerves II through XII grossly intact.  Patient does have symmetric strength of his bilateral lower extremities.  No other obvious evidence of gross trauma to the extremities. ____________________________________________   LABS (all labs ordered are listed, but only abnormal results are displayed)  Labs Reviewed  CBC WITH DIFFERENTIAL/PLATELET - Abnormal; Notable for the following components:      Result Value   HCT 38.6 (*)    All other components within normal limits  COMPREHENSIVE METABOLIC PANEL - Abnormal; Notable for the following components:   BUN 24 (*)    All other components within normal limits   ____________________________________________  EKG  Sinus bradycardia with ventricular rate 59, normal axis, unremarkable intervals no clear  evidence of acute ischemia.  ____________________________________________  RADIOLOGY  ED MD interpretation: No pneumothorax, effusion or other acute intrathoracic process.  There is evidence of emphysema and chronic old appearing left-sided rib fractures.  CT head and C-spine showed no evidence of acute injury or intracranial hemorrhage or other acute intracranial process.  Stable appearing generalized atrophy and ventriculomegaly.  Official radiology report(s): DG Chest 2 View  Result Date: 05/02/2020 CLINICAL DATA:  70 year old with multiple recent falls and worsening pain involving the neck, back, knees and hands. Current smoker. EXAM: CHEST - 2 VIEW COMPARISON:  12/27/2015 and earlier. FINDINGS: Cardiac silhouette normal in size, unchanged. Thoracic aorta mildly atherosclerotic, unchanged. Hilar and mediastinal contours otherwise unremarkable. Emphysematous changes throughout both lungs, mild hyperinflation, mildly prominent bronchovascular markings diffusely and mild central peribronchial thickening, unchanged. Lungs otherwise clear. Pulmonary vascularity normal. No pleural effusions. Osseous demineralization, thoracolumbar dextroscoliosis, and old healed LEFT rib fractures. IMPRESSION: 1. No acute cardiopulmonary disease. 2.  Emphysema. ([id number](ICD10-J43.9) Electronically Signed   By: Hulan Saashomas  Lawrence M.D.   On: 05/02/2020 16:41   CT Head Wo Contrast  Result Date: 05/02/2020 CLINICAL DATA:  Head trauma, minor (Age >= 65y) Head and neck pain after multiple falls. EXAM: CT HEAD WITHOUT CONTRAST TECHNIQUE: Contiguous axial images were obtained from the base of the skull through the vertex without intravenous contrast. COMPARISON:  Head CT 01/30/2019 FINDINGS: Brain: Generalized atrophy is similar to prior. There is ventriculomegaly, similar to prior exam. No hemorrhage or evidence of acute ischemia. No subdural or extra-axial collection. No midline shift or mass effect. Vascular: No hyperdense vessel or  unexpected calcification. Skull: No fracture or focal lesion. Sinuses/Orbits: Occasional opacification of ethmoid air cells, improved in appearance from prior. Mastoid air cells are clear. Orbits are unremarkable. Other: No confluent scalp contusion. IMPRESSION: 1. No acute intracranial abnormality. No skull fracture. 2. Generalized atrophy. Ventriculomegaly is likely due to central atrophy, and  stable from prior exams. Recommend clinical correlation for symptoms of normal pressure hydrocephalus. Electronically Signed   By: Narda Rutherford M.D.   On: 05/02/2020 16:48   CT Cervical Spine Wo Contrast  Result Date: 05/02/2020 CLINICAL DATA:  Neck trauma (Age >= 65y) Headache and neck pain after multiple falls. EXAM: CT CERVICAL SPINE WITHOUT CONTRAST TECHNIQUE: Multidetector CT imaging of the cervical spine was performed without intravenous contrast. Multiplanar CT image reconstructions were also generated. COMPARISON:  Radiograph 11/27/2015, CT 07/30/2014 FINDINGS: Alignment: No traumatic subluxation. Similar grade 1 anterolisthesis of C4 on C5 fluid compared to prior exam. Facets are normally aligned. Skull base and vertebrae: No acute fracture. Posterior fusion from C5 through T3 fixating prior T1 compression fracture. The dens and skull base are intact. Soft tissues and spinal canal: No prevertebral fluid or swelling. No visible canal hematoma. Disc levels: Posterior fusion from C5 through T3 fixating remote T1 fracture. No periprosthetic lucency. The left C5 screw approaches the facet. Disc space narrowing and endplate spurring at C4-C5 with mild Modic endplate changes. Facet hypertrophy in the upper cervical spine. Upper chest: Mild apical emphysema.  No acute findings. Other: Carotid calcifications. IMPRESSION: 1. No acute fracture or subluxation of the cervical spine. 2. Posterior fusion from C5 through T3 fixating remote T1 compression fracture. 3. Degenerative disc disease at C4-C5 with unchanged  anterolisthesis. Multilevel facet hypertrophy. Electronically Signed   By: Narda Rutherford M.D.   On: 05/02/2020 16:53    ____________________________________________   PROCEDURES  Procedure(s) performed (including Critical Care):  Procedures   ____________________________________________   INITIAL IMPRESSION / ASSESSMENT AND PLAN / ED COURSE      Patient presents with above-stated exam for assessment of some pain in all of his extremities that seems to be getting slightly worse over the last couple weeks but have been present for at least several years.  He also notes he has been falling more frequently of last couple weeks and thinks he felt overwhelmed 3 times in the past week.  He notes he is supposed to be using a walker but has not been using it because he does not want to.  He is not sure if he hit his head or passed out although does not endorse any other acute sick symptoms.  No focal deficits to suggest CVA.  CT head and C-spine show no evidence of acute injury or subarachnoid hemorrhage.  CBC and CMP showed no significant acute anemia or leukocytosis or significant electrolyte or metabolic derangements.  Very low suspicion for toxic ingestion.  Suspect patient has some chronic instability secondary to severe arthritis and possible NPH.  While his ECG does have a heart rate of 59 I have a low suspicion for symptomatic arrhythmia, bradycardia or ACS at this time.  Given patient states that he has been falling on and off for at least several weeks but may be a little more over the past week but not using his walker I counseled him extensively importance of using his walker at all times as recommended.  Patient voiced understanding agreement this plan.  Advised he can add Tylenol to his ibuprofen regiment but that he must continue following up with his PCP.  Patient denies any other acute concerns at this time and he was observed ambulate around the emergency room with steady gait  unassisted despite recommendation to only ambulate with a walker.  Given stable vitals with chronicity of symptoms otherwise reassuring exam work-up I believe he is safe for discharge with plan  for continued outpatient follow-up and evaluation.  Discharged in  stable condition.   ____________________________________________   FINAL CLINICAL IMPRESSION(S) / ED DIAGNOSES  Final diagnoses:  Arthritis  Gait instability    Medications  acetaminophen (TYLENOL) tablet 1,000 mg (1,000 mg Oral Given 05/02/20 1626)     ED Discharge Orders    None       Note:  This document was prepared using Dragon voice recognition software and may include unintentional dictation errors.   Gilles Chiquito, MD 05/02/20 1700

## 2020-12-29 ENCOUNTER — Other Ambulatory Visit: Payer: Self-pay | Admitting: Pain Medicine

## 2020-12-29 ENCOUNTER — Ambulatory Visit
Admission: RE | Admit: 2020-12-29 | Discharge: 2020-12-29 | Disposition: A | Discharge status: Home / Self Care | Payer: Medicare Other | Attending: Pain Medicine | Admitting: Pain Medicine

## 2020-12-29 ENCOUNTER — Ambulatory Visit
Admission: RE | Admit: 2020-12-29 | Discharge: 2020-12-29 | Disposition: A | Discharge status: Home / Self Care | Payer: Medicare Other | Source: Ambulatory Visit | Attending: Pain Medicine | Admitting: Pain Medicine

## 2020-12-29 DIAGNOSIS — M545 Low back pain, unspecified: Secondary | ICD-10-CM

## 2020-12-29 DIAGNOSIS — M25561 Pain in right knee: Secondary | ICD-10-CM | POA: Insufficient documentation

## 2020-12-29 DIAGNOSIS — M25562 Pain in left knee: Secondary | ICD-10-CM

## 2021-07-03 IMAGING — CT CT CERVICAL SPINE W/O CM
3 of 4 series · 12 of 33 positions shown, 14 images · non-contrast
Comparison: Radiograph 11/27/2015, CT 07/30/2014

CLINICAL DATA: Neck trauma (Age >= 65y)

Headache and neck pain after multiple falls.
EXAM:
CT CERVICAL SPINE WITHOUT CONTRAST
TECHNIQUE: Multidetector CT imaging of the cervical spine was performed without
intravenous contrast. Multiplanar CT image reconstructions were also
generated.

[Series 6: sagittal bone · sagittal · 0.23mm/px · 5 of 56 slices shown, 6 images]
[im 19/56  bone]
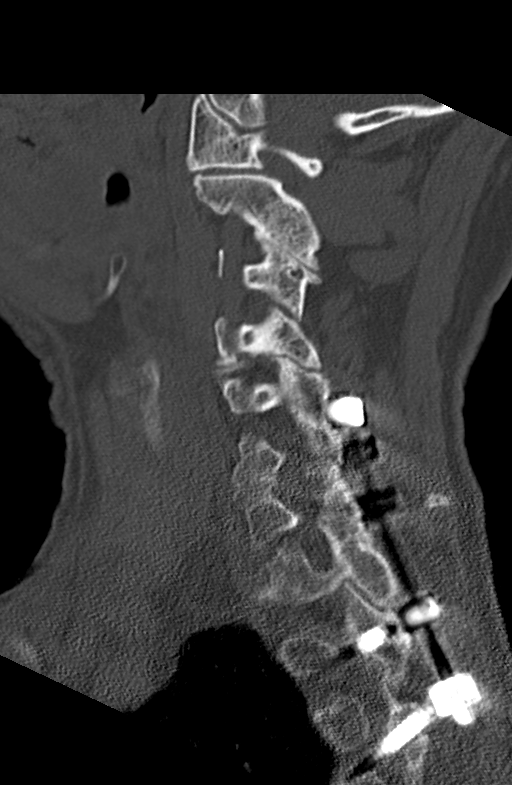
[im 23/56  bone]
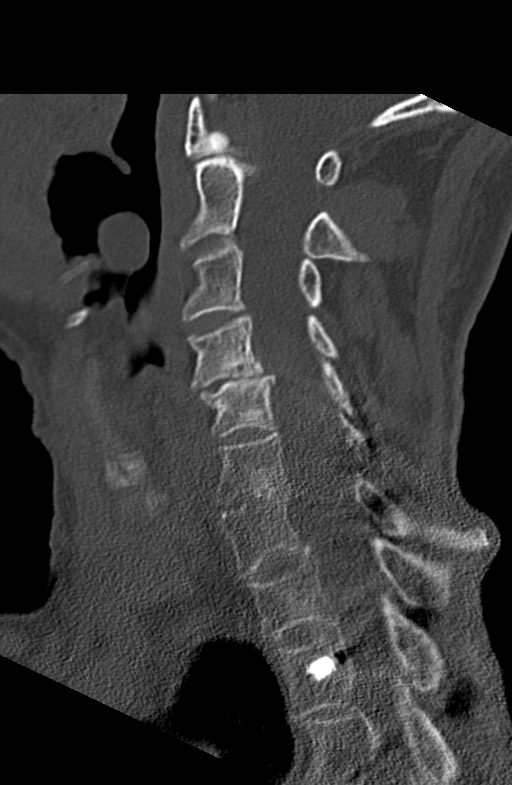
[im 28/56  soft-tissue]
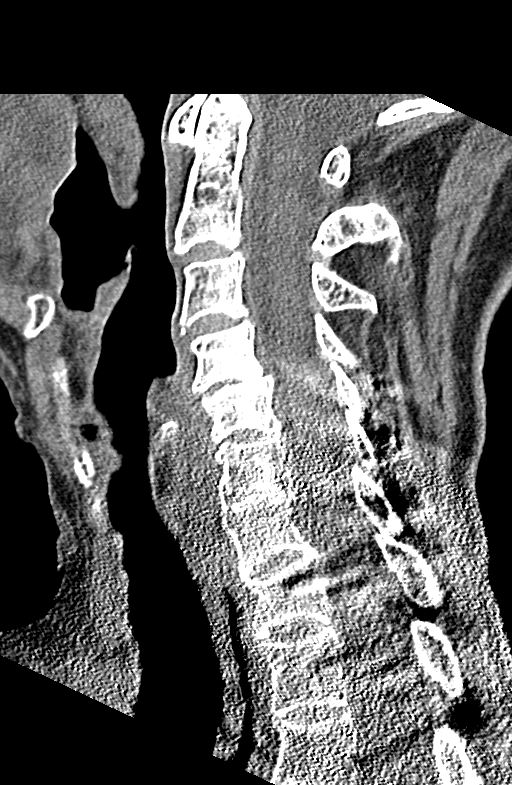
[im 28/56  bone]
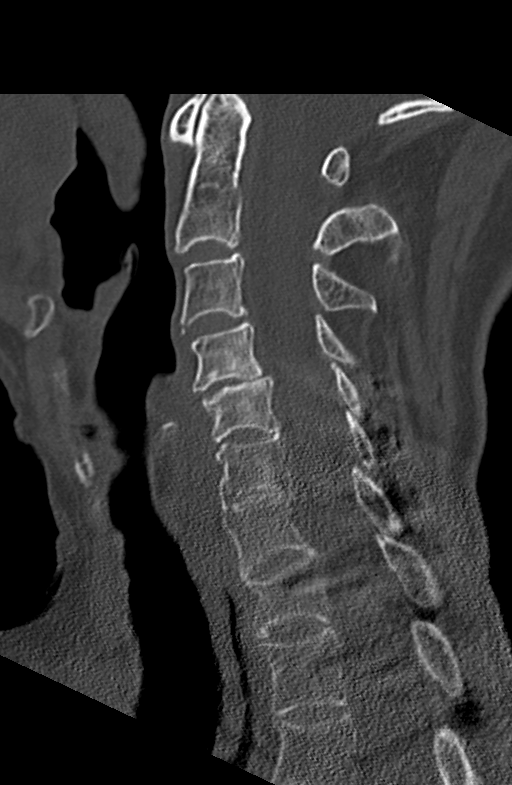
[im 33/56  bone]
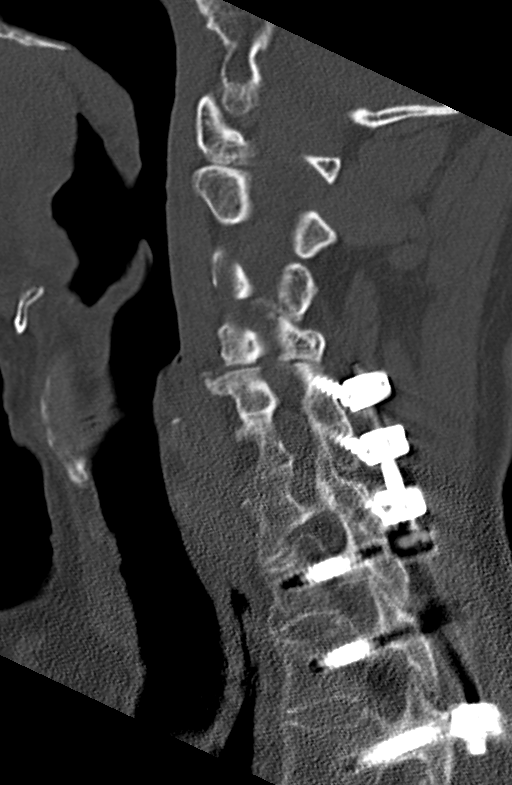
[im 37/56  bone]
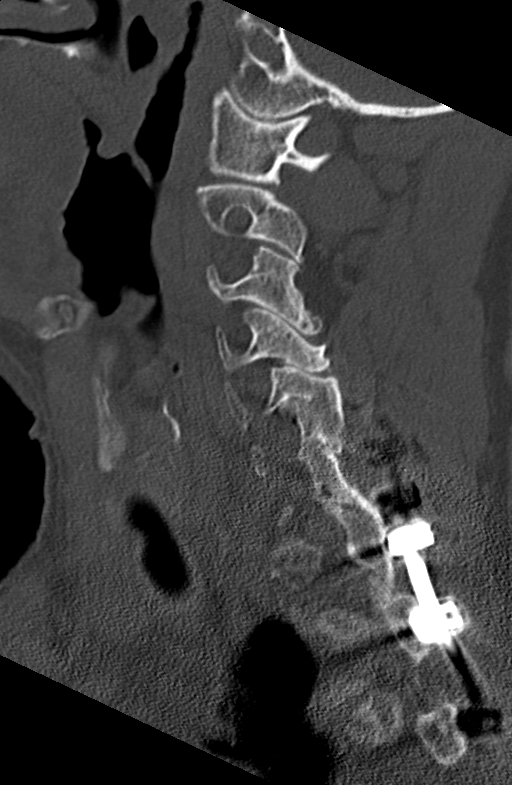

[Series 7: coronal bone · coronal · 0.22mm/px · 3 of 61 slices shown]
[im 13/61  bone]
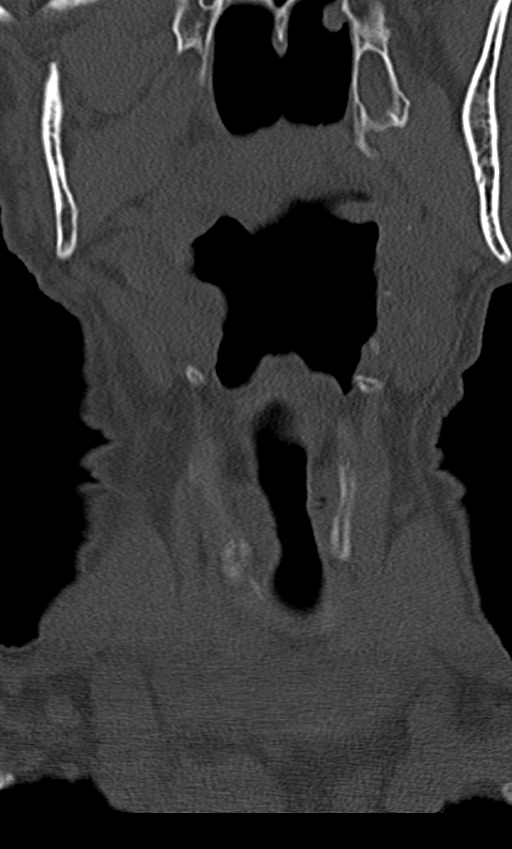
[im 25/61  bone]
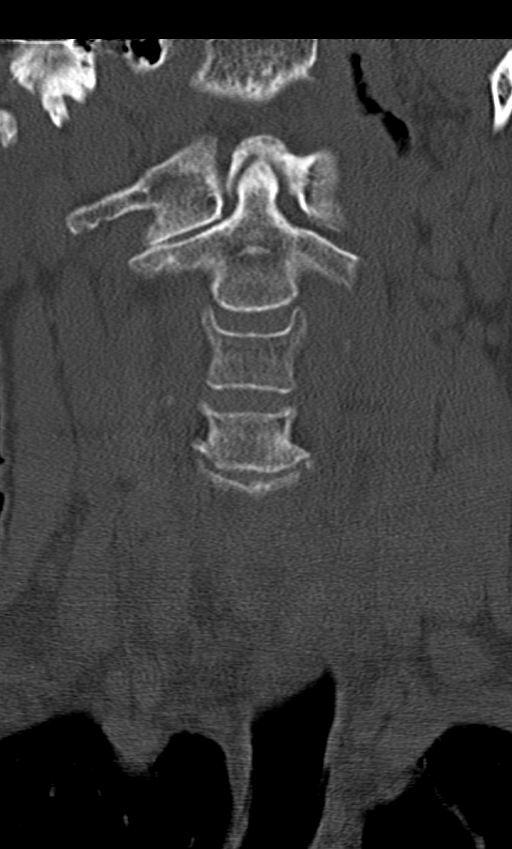
[im 36/61  bone]
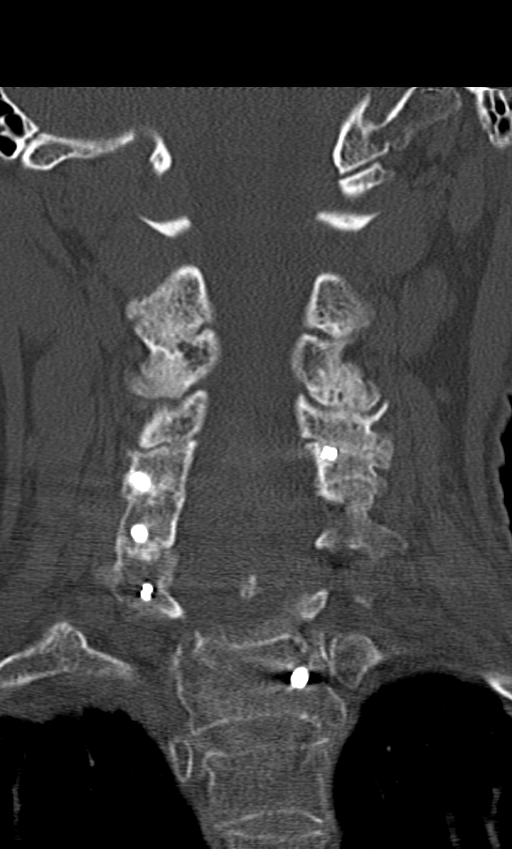

[Series 8: orthogonal bone · axial · 0.22mm/px · z∈[-285,-174]mm · 4 of 93 slices shown, 5 images]
[im 16/93  soft-tissue]
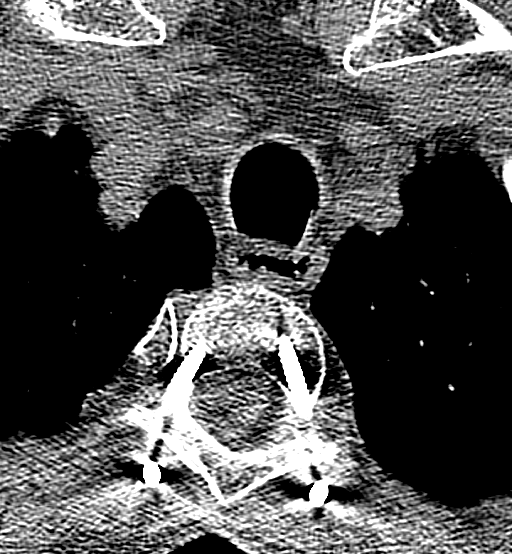
[im 16/93  bone]
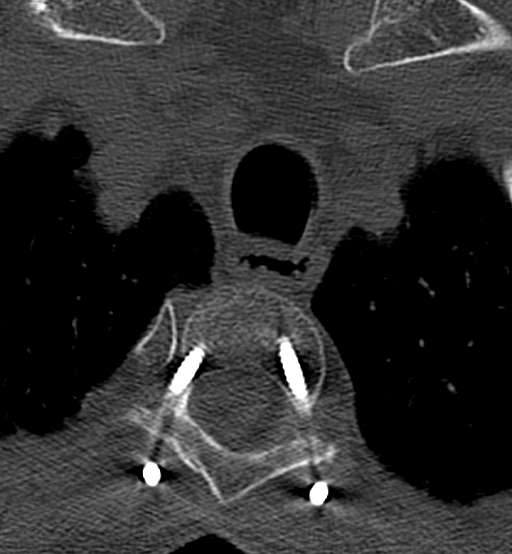
[im 31/93  bone]
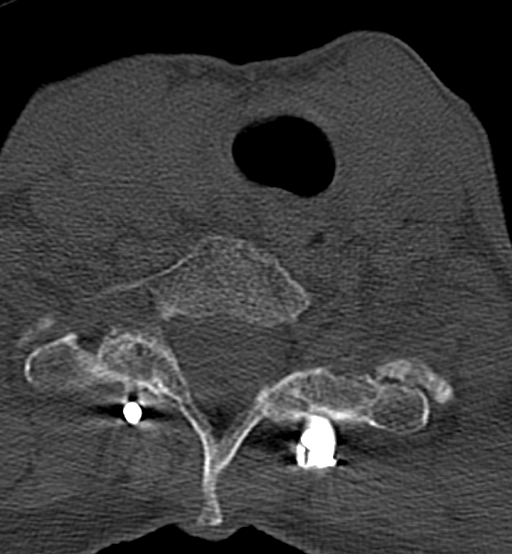
[im 62/93  bone]
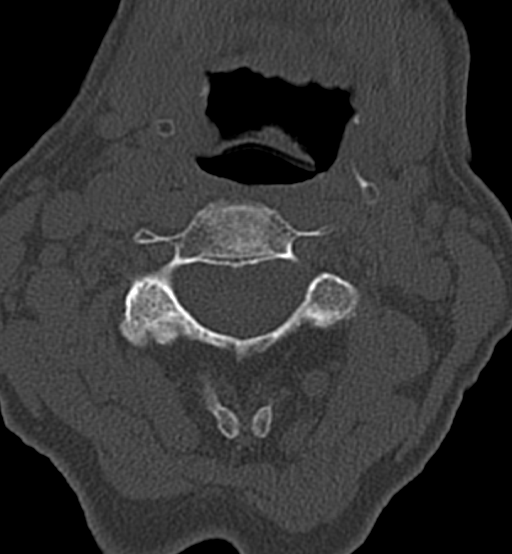
[im 77/93  bone]
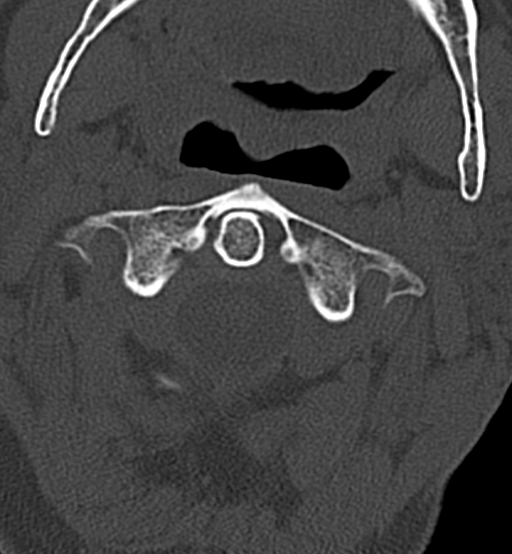

[12 of 33 positions shown; findings below may reference images not displayed]

FINDINGS: Alignment: No traumatic subluxation. Similar grade 1 anterolisthesis
of C4 on C5 fluid compared to prior exam. Facets are normally
aligned.

Skull base and vertebrae: No acute fracture. Posterior fusion from
C5 through T3 fixating prior T1 compression fracture. The dens and
skull base are intact.

Soft tissues and spinal canal: No prevertebral fluid or swelling. No
visible canal hematoma.

Disc levels: Posterior fusion from C5 through T3 fixating remote T1
fracture. No periprosthetic lucency. The left C5 screw approaches
the facet. Disc space narrowing and endplate spurring at C4-C5 with
mild Modic endplate changes. Facet hypertrophy in the upper cervical
spine.

Upper chest: Mild apical emphysema.  No acute findings.

Other: Carotid calcifications.
IMPRESSION: 1. No acute fracture or subluxation of the cervical spine.
2. Posterior fusion from C5 through T3 fixating remote T1
compression fracture.
3. Degenerative disc disease at C4-C5 with unchanged
anterolisthesis. Multilevel facet hypertrophy.

## 2022-03-01 IMAGING — CR DG LUMBAR SPINE COMPLETE 4+V
1 series · 5 of 5 positions shown · non-contrast
Comparison: 11/02/2017

CLINICAL DATA: Low back pain without trauma

EXAM:
LUMBAR SPINE - COMPLETE 4+ VIEW

[Series 1: dg lumbar spine complete 4 +v · 0.14mm/px · 5 of 5 slices shown]
[im 1/5]
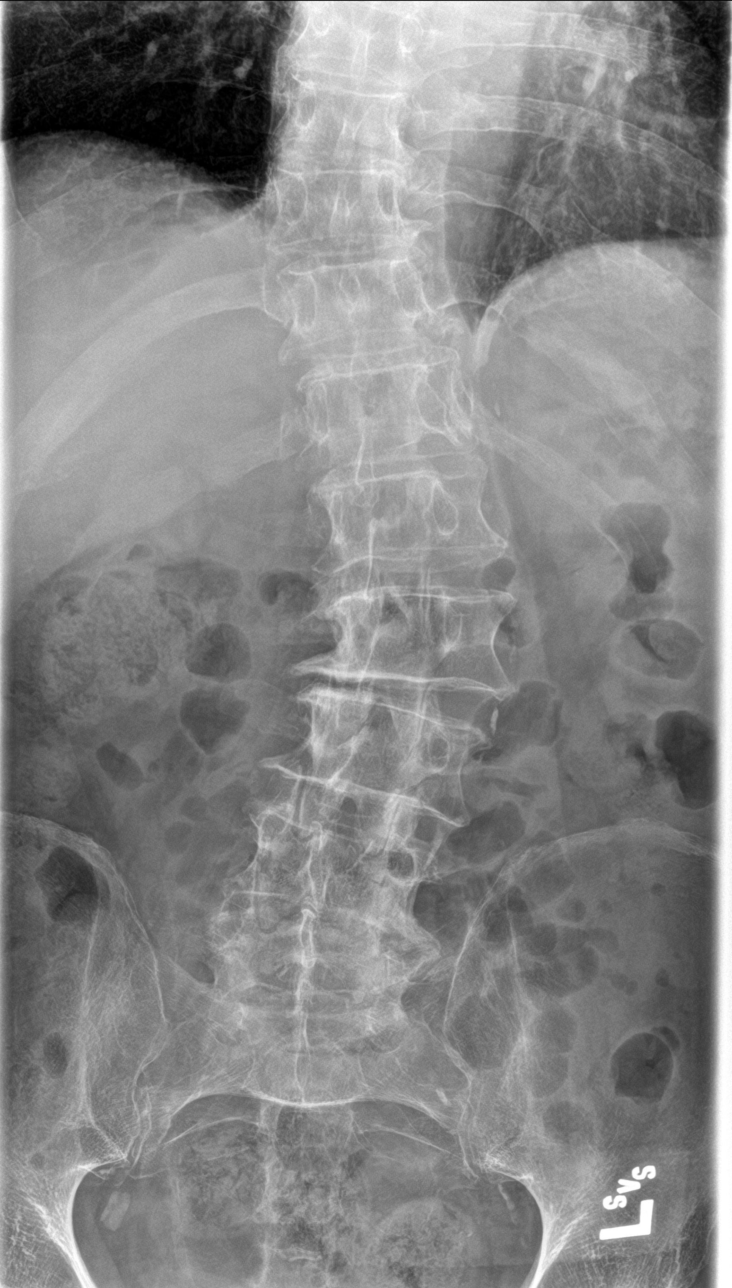
[im 2/5]
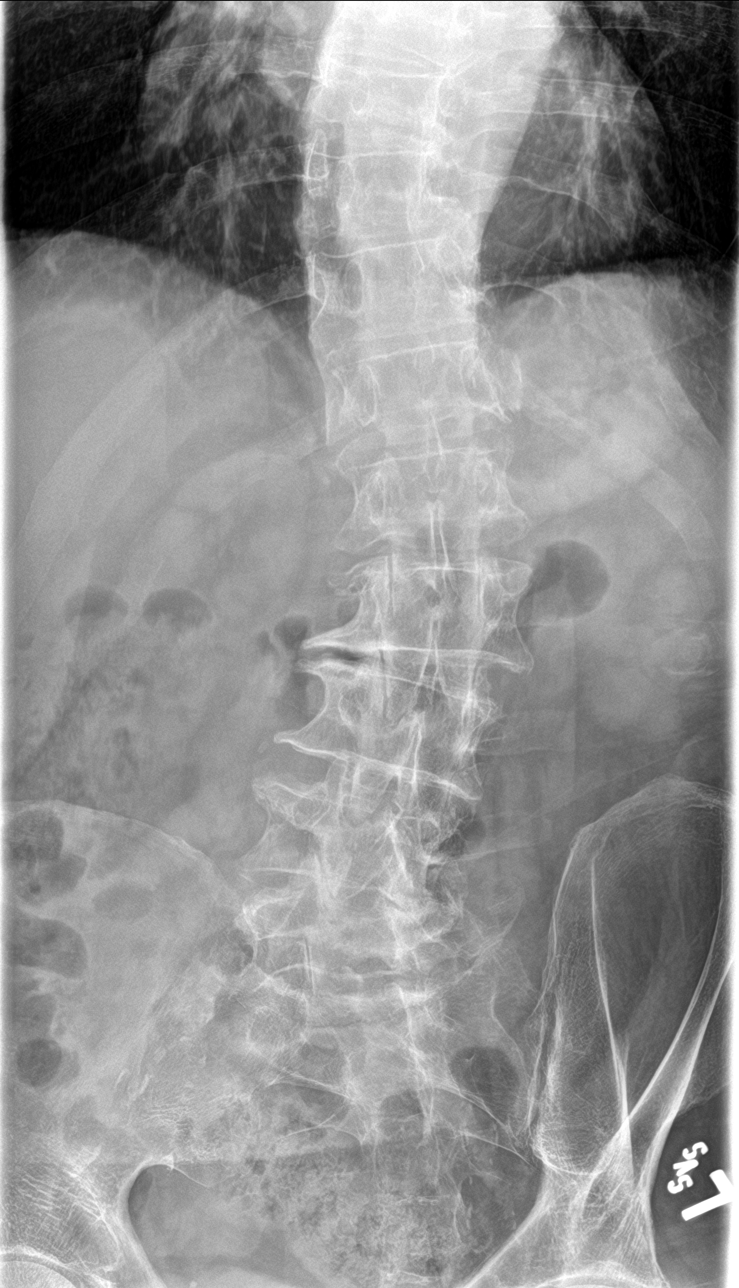
[im 3/5]
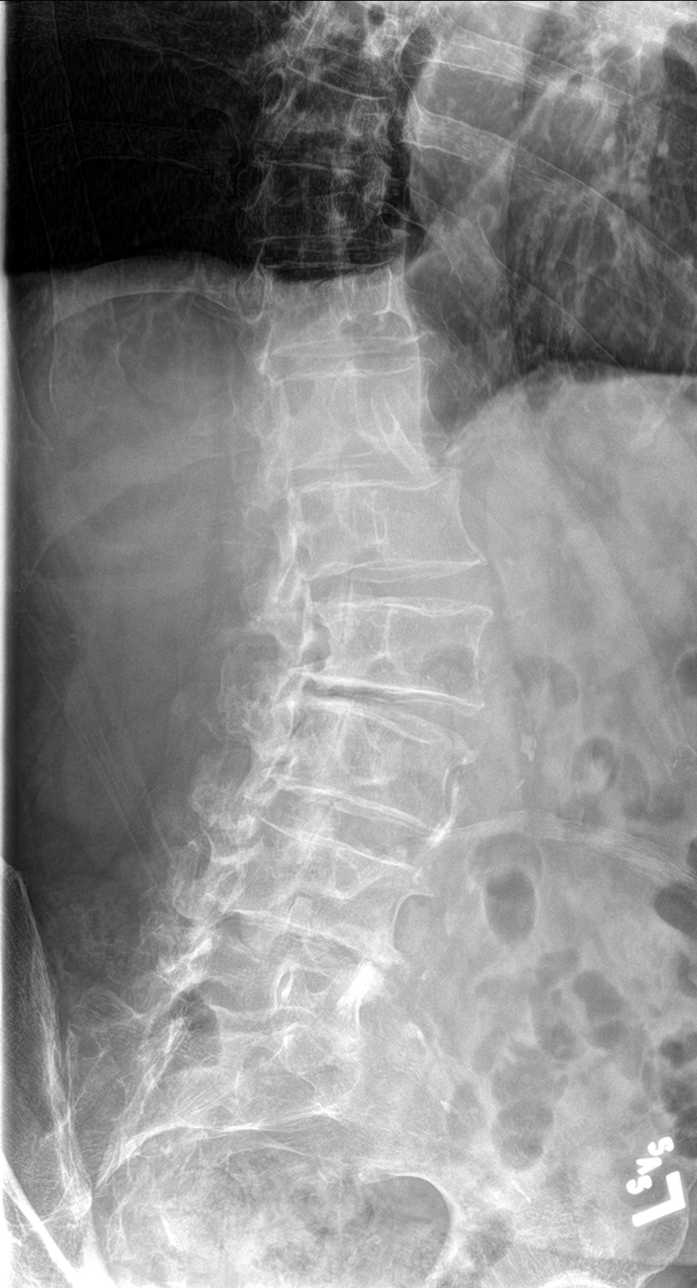
[im 4/5]
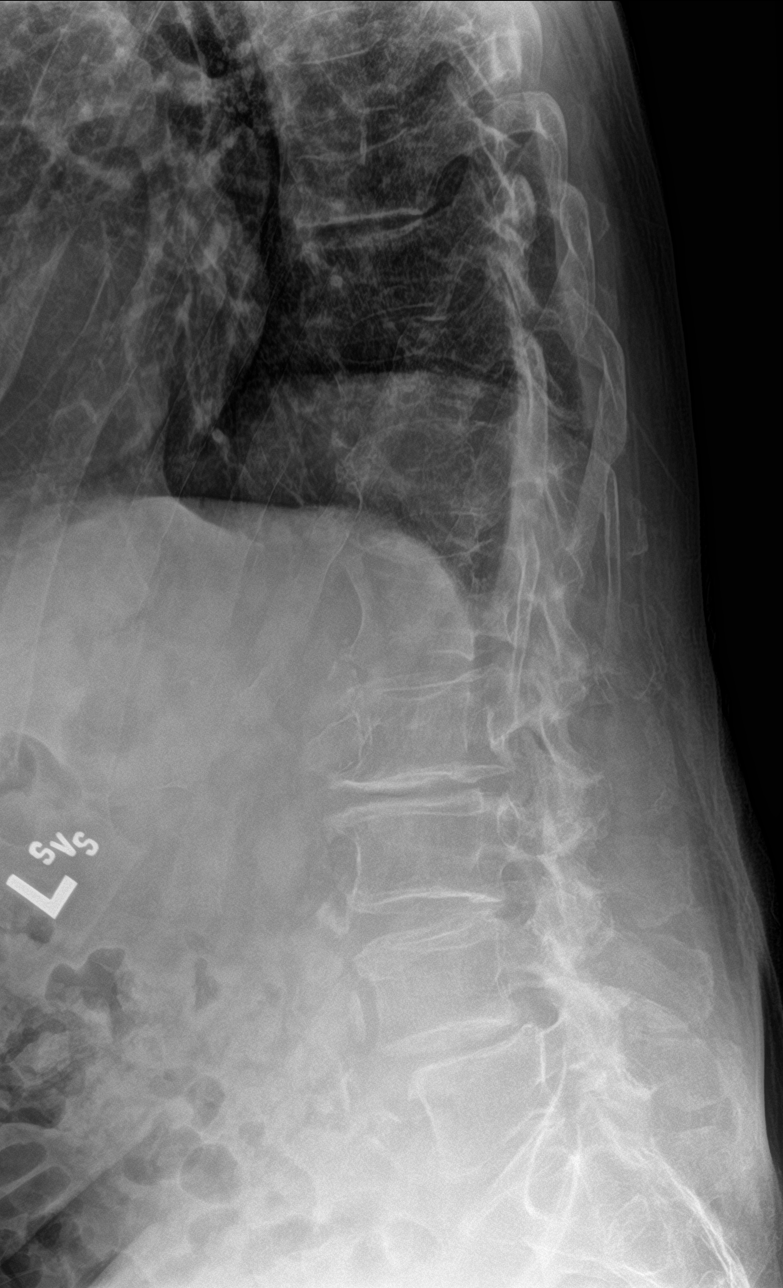
[im 5/5]
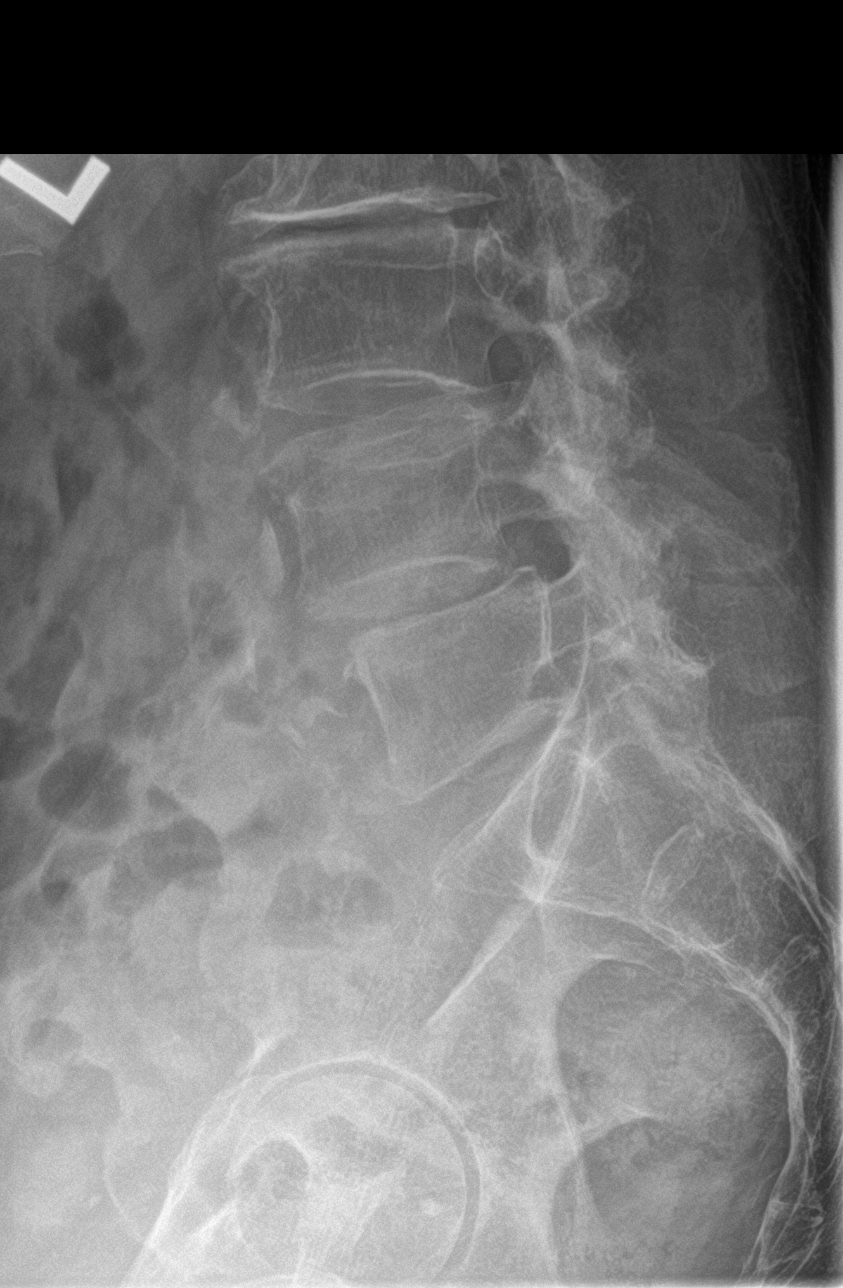

[5 of 5 positions shown; findings below may reference images not displayed]

FINDINGS: Stable thoracolumbar scoliosis. Lumbar degenerative disc disease
most marked L2-3 and L4-5. Grade 1 anterolisthesis L4-5 stable. No
fracture or dislocation. Patchy aortic calcifications without
suggestion of aneurysm.
IMPRESSION: 1. Negative for fracture or other acute finding.
2. Thoracolumbar scoliosis with spondylitic changes as above.

## 2022-05-05 ENCOUNTER — Other Ambulatory Visit: Payer: Self-pay

## 2022-05-05 ENCOUNTER — Emergency Department: Payer: No Typology Code available for payment source

## 2022-05-05 ENCOUNTER — Emergency Department
Admission: EM | Admit: 2022-05-05 | Discharge: 2022-05-05 | Disposition: A | Discharge status: Home / Self Care | Payer: No Typology Code available for payment source | Attending: Emergency Medicine | Admitting: Emergency Medicine

## 2022-05-05 DIAGNOSIS — S3992XA Unspecified injury of lower back, initial encounter: Secondary | ICD-10-CM | POA: Diagnosis present

## 2022-05-05 DIAGNOSIS — Z720 Tobacco use: Secondary | ICD-10-CM | POA: Diagnosis not present

## 2022-05-05 DIAGNOSIS — W010XXA Fall on same level from slipping, tripping and stumbling without subsequent striking against object, initial encounter: Secondary | ICD-10-CM | POA: Diagnosis not present

## 2022-05-05 DIAGNOSIS — S32018A Other fracture of first lumbar vertebra, initial encounter for closed fracture: Secondary | ICD-10-CM | POA: Diagnosis not present

## 2022-05-05 DIAGNOSIS — S32019A Unspecified fracture of first lumbar vertebra, initial encounter for closed fracture: Secondary | ICD-10-CM

## 2022-05-05 MED ORDER — LIDOCAINE 5 % EX PTCH: 1.0000 | MEDICATED_PATCH | Freq: Two times a day (BID) | CUTANEOUS | 0 refills | Status: AC

## 2022-05-05 MED ORDER — MORPHINE SULFATE (PF) 4 MG/ML IV SOLN
4.0000 mg | Freq: Once | INTRAVENOUS | Status: AC
  Administered 2022-05-05: 4 mg via INTRAMUSCULAR
  Filled 2022-05-05: qty 1

## 2022-05-05 MED ORDER — LIDOCAINE 5 % EX PTCH
1.0000 | MEDICATED_PATCH | CUTANEOUS | Status: DC
  Administered 2022-05-05: 1 via TRANSDERMAL
  Filled 2022-05-05: qty 1

## 2022-05-05 MED ORDER — ACETAMINOPHEN 325 MG PO TABS
650.0000 mg | ORAL_TABLET | Freq: Once | ORAL | Status: AC
  Administered 2022-05-05: 650 mg via ORAL
  Filled 2022-05-05: qty 2

## 2022-05-05 MED ORDER — OXYCODONE HCL 5 MG PO TABS: 5.0000 mg | ORAL_TABLET | Freq: Three times a day (TID) | ORAL | 0 refills | Status: DC | PRN

## 2022-05-05 NOTE — ED Triage Notes (Signed)
Pt reports 3-4 days ago, fell against a wall and hurt his mid back. Pt reports history of a broken back and issues with his back. Pt reports pain is still there and hurts worse with movement. Denies LOC or hitting head.

## 2022-05-05 NOTE — Discharge Instructions (Addendum)
Your CAT scan shows a fracture of your L1 vertebrae.  You were given a TLSO brace.  Please follow-up with Dr. Cari Caraway, call the phone number provided in order to make this appointment.  In the meantime, you may take the medications as prescribed as needed for pain.  Remember that oxycodone is highly addictive and should only be used for breakthrough pain when Tylenol and Lidoderm patches do not work first.  Also remember that you cannot drive, operate heavy machinery, or perform any test that require concentration while taking this medication.  You should only take this medication when somebody is with you.Hector George  Please return for any new, worsening, or change in symptoms or other concerns.

## 2022-05-05 NOTE — Progress Notes (Signed)
Orthopedic Tech Progress Note Patient Details:  Hector George 02/24/1951 [id number]  Order for STAT TLSO called into [facility] at 1350.  Patient ID: Hector George, male   DOB: 1950-08-24, 72 y.o.   MRN: [id number]  [name] 05/05/2022, 2:00 PM

## 2022-05-05 NOTE — ED Provider Notes (Signed)
[facility] Provider Note    Event Date/Time   First MD Initiated Contact with Patient 05/05/22 1105     (approximate)   History   Fall   HPI  Hector George is a 72 y.o. male with a past medical history of bipolar disorder, PTSD, T1 and T3 spinal fracture who presents today for evaluation of back pain.  Patient reports that he tripped and fell against a table approximately 5 days ago.  He denies fall to the ground.  There was no head strike or LOC.  He reports that he has had pain in his mid back ever since.  The pain is nonradiating.  No associated paresthesias or weakness in his legs.  No abdominal pain.  He is still able to ambulate, though has pain with ambulation.  No nausea or vomiting.  No headache or neck pain.  Patient Active Problem List   Diagnosis Date Noted   Bipolar I disorder, most recent episode mixed ([place]) 12/10/2018   PTSD (post-traumatic stress disorder) 12/10/2018   Insomnia due to mental condition 12/10/2018   Cannabis use disorder, moderate, dependence ([place]) 12/10/2018   Alcohol use disorder, severe, in sustained remission ([employer]) 12/10/2018   Tobacco use disorder 12/10/2018   High risk medication use 12/10/2018   Noncompliance with treatment regimen 12/10/2018   Affective bipolar disorder ([place]) 10/17/2017   Arthritis of knee 10/17/2017   Nightmares associated with chronic post-traumatic stress disorder 10/17/2017   Neck pain 04/12/2015   Closed T1 spinal fracture ([place]) 07/31/2014   Traumatic compression fracture of T3 vertebra ([place]) 07/31/2014          Physical Exam   Triage Vital Signs: ED Triage Vitals  Enc Vitals Group     BP 05/05/22 1051 (!) 131/99     Pulse Rate 05/05/22 1051 85     Resp 05/05/22 1051 16     Temp 05/05/22 1051 97.8 F (36.6 C)     Temp Source 05/05/22 1051 Oral     SpO2 05/05/22 1051 100 %     Weight 05/05/22 1048 132 lb 4.4 oz (60 kg)     Height 05/05/22 1048 '5\' 8"'$  (1.727 m)     Head  Circumference --      Peak Flow --      Pain Score 05/05/22 1048 10     Pain Loc --      Pain Edu? --      Excl. in [place]? --     Most recent vital signs: Vitals:   05/05/22 1051  BP: (!) 131/99  Pulse: 85  Resp: 16  Temp: 97.8 F (36.6 C)  SpO2: 100%    Physical Exam Vitals and nursing note reviewed.  Constitutional:      General: Awake and alert. No acute distress.    Appearance: Normal appearance. The patient is normal weight.  HENT:     Head: Normocephalic and atraumatic.     Mouth: Mucous membranes are moist.  Eyes:     General: PERRL. Normal EOMs        Right eye: No discharge.        Left eye: No discharge.     Conjunctiva/sclera: Conjunctivae normal.  Cardiovascular:     Rate and Rhythm: Normal rate and regular rhythm.     Pulses: Normal pulses.  Pulmonary:     Effort: Pulmonary effort is normal. No respiratory distress.     Breath sounds: Normal breath sounds.  Abdominal:  Abdomen is soft. There is no abdominal tenderness. No rebound or guarding. No distention. Musculoskeletal:        General: No swelling. Normal range of motion.     Cervical back: Normal range of motion and neck supple.  Midline lumbar and lower thoracic vertebral tenderness and mild paraspinal vertebral tenderness.  No ecchymosis or swelling.  No wounds noted. Strength and sensation 5/5 to bilateral lower extremities. Normal great toe extension against resistance. Normal sensation throughout feet. Normal patellar reflexes. Negative SLR and opposite SLR bilaterally.  Normal gait. Skin:    General: Skin is warm and dry.     Capillary Refill: Capillary refill takes less than 2 seconds.     Findings: No rash.  Neurological:     Mental Status: The patient is awake and alert.      ED Results / Procedures / Treatments   Labs (all labs ordered are listed, but only abnormal results are displayed) Labs Reviewed - No data to display   EKG     RADIOLOGY I independently reviewed and  interpreted imaging and agree with radiologists findings.     PROCEDURES:  Critical Care performed:   Procedures   MEDICATIONS ORDERED IN ED: Medications  lidocaine (LIDODERM) 5 % 1 patch (1 patch Transdermal Patch Applied 05/05/22 1126)  morphine (PF) 4 MG/ML injection 4 mg (4 mg Intramuscular Given 05/05/22 1124)  acetaminophen (TYLENOL) tablet 650 mg (650 mg Oral Given 05/05/22 1125)     IMPRESSION / MDM / ASSESSMENT AND PLAN / ED COURSE  I reviewed the triage vital signs and the nursing notes.   Differential diagnosis includes, but is not limited to, vertebral fracture, compression fracture, contusion.  Not consistent with cord compression or cauda equina.  Patient is awake and alert, hemodynamically stable and afebrile.  He has 5 out of 5 strength with intact sensation to extensor hallucis dorsiflexion and plantarflexion of bilateral lower extremities with normal patellar reflexes bilaterally. Most likely etiology at this point is vertebral fracture versus contusion. No history or physical exam findings to suggest cauda equina syndrome or spinal cord compression. No focal neurological deficits on exam. No constitutional symptoms or history of immunosuppression or IVDA to suggest potential for epidural abscess. Not anticoagulated, no history of bleeding diastasis to suggest risk for epidural hematoma. No chronic steroid use or advanced age or history of malignancy to suggest proclivity towards pathological fracture.  No abdominal pain or flank pain to suggest kidney stone, no history of kidney stone.  No fever or dysuria or CVAT to suggest pyelonephritis.  No chest pain, back pain, shortness of breath, neurological deficits, to suggest vascular catastrophe, and pulses are equal in all 4 extremities.    Patient does have midline vertebral tenderness.  Therefore CT thoracic and lumbar spine obtained.  Patient has a mild superior endplate compression fracture of the L1 vertebral body with  approximately 25% vertebral height loss, fracture appears to be subacute or chronic.  Patient is ambulatory with a steady gait.  This is the location in which she is tender, I suspect is from his recent injury 5 days ago.  Patient was placed in a TLSO brace and instructed to follow-up with neurosurgery.  The appropriate follow-up information was provided.  Pain was controlled in the emergency department.  He was given a very small amount of oxycodone to take at home for breakthrough pain.  He was advised that this medication is highly addictive and should only be used for breakthrough pain when Tylenol and  Lidoderm patches do not work first.  He was also advised that he cannot drive, operate heavy machinery, or perform any test that require concentration while taking this medication.  I advised that he only did this medication in the presence of a friend or family member in case of gait imbalance.  He tolerated the IM morphine well, and is ambulatory around the emergency department.  Discussed care instructions and return precautions with patient. Recommended close outpatient follow-up for re-evaluation. Patient agrees with plan of care.  We discussed return precautions for any worsening or different pain or development of any neurologic symptoms. Educated patient regarding expected time course for back pain to improve and recommended very close outpatient follow-up.  Patient understands and agrees with plan.  He was discharged with his family member.   Patient's presentation is most consistent with acute complicated illness / injury requiring diagnostic workup.  Clinical Course as of 05/05/22 1816  Sat May 05, 2022  1456 Patient does not wish to wait for his brace to arrive.  He is requesting discharge now [JP]  1500 Brace arrived, patient getting brace now [JP]    Clinical Course User Index [JP] Shaton Lore, Clarnce Flock, PA-C     FINAL CLINICAL IMPRESSION(S) / ED DIAGNOSES   Final diagnoses:  Closed  fracture of first lumbar vertebra, unspecified fracture morphology, initial encounter ([place])     Rx / DC Orders   ED Discharge Orders          Ordered    oxyCODONE (ROXICODONE) 5 MG immediate release tablet  Every 8 hours PRN        05/05/22 1325    lidocaine (LIDODERM) 5 %  Every 12 hours        05/05/22 1325             Note:  This document was prepared using Dragon voice recognition software and may include unintentional dictation errors.   Marquette Old, PA-C 05/05/22 1816    Harvest Dark, MD 05/05/22 1845

## 2022-05-18 NOTE — Progress Notes (Addendum)
Referring Physician:  Minna Antis, MD [address] [place],  [place] [address]  Primary Physician:  [facility]  History of Present Illness: 05/22/2022 [name] has a history of PTSD, bipolar.   History of fusion C5-T3.   Seen in ED on 05/05/22 for back pain after fall 5 days prior. History of T1 and T9 fractures along with T3 spinus process fracture. Found to have subacute fracture of L1 on CT scan. He was placed in TLSO and is here for follow up.   He has constant LBP with no leg pain. He is wearing his TLSO brace. He feels like his pain is improving. He has no numbness, tingling, or weakness in his legs.   Review of Systems:  A 10 point review of systems is negative, except for the pertinent positives and negatives detailed in the HPI.  Past Medical History: Past Medical History:  Diagnosis Date   Affective bipolar disorder (HCC)    Anxiety    Claudication, class I (HCC)    Depression    Left inguinal hernia    Lumbago    Nightmares associated with chronic post-traumatic stress disorder    Osteoarthritis     Past Surgical History: Past Surgical History:  Procedure Laterality Date   BACK SURGERY     CERVICAL SPINE SURGERY     INGUINAL HERNIA REPAIR Left 02/13/2018   Procedure: HERNIA REPAIR INGUINAL ADULT;  Surgeon: Sung Amabile, DO;  Location: [facility] ORS;  Service: General;  Laterality: Left;   NOSE SURGERY      Allergies: Allergies as of 05/22/2022   (No Known Allergies)    Medications: Outpatient Encounter Medications as of 05/22/2022  Medication Sig   divalproex (DEPAKOTE ER) 500 MG 24 hr tablet Take 2 tablets (1,000 mg total) by mouth daily with supper.   FLUoxetine (PROZAC) 40 MG capsule Take 1 capsule (40 mg total) by mouth daily.   ibuprofen (ADVIL,MOTRIN) 800 MG tablet Take 1 tablet (800 mg total) by mouth every 8 (eight) hours as needed for mild pain or moderate pain.   meclizine (ANTIVERT) 25 MG tablet Take 1 tablet (25  mg total) by mouth 3 (three) times daily as needed for dizziness or nausea.   OLANZapine (ZYPREXA) 15 MG tablet Take 1 tablet (15 mg total) by mouth at bedtime. For mood and sleep   traZODone (DESYREL) 50 MG tablet Take 1-2 tablets (50-100 mg total) by mouth at bedtime. For sleep   [DISCONTINUED] oxyCODONE (ROXICODONE) 5 MG immediate release tablet Take 1 tablet (5 mg total) by mouth every 8 (eight) hours as needed.   No facility-administered encounter medications on file as of 05/22/2022.    Social History: Social History   Tobacco Use   Smoking status: Every Day    Packs/day: .25    Types: Cigarettes   Smokeless tobacco: Never  Vaping Use   Vaping Use: Never used  Substance Use Topics   Alcohol use: No   Drug use: Yes    Types: Marijuana    Comment: daily    Family Medical History: Family History  Problem Relation Age of Onset   Heart disease Mother    Anxiety disorder Brother    Depression Brother    Anxiety disorder Sister    Depression Sister    Cancer Neg Hx     Physical Examination: Vitals:   05/22/22 1101  BP: 134/84    General: Patient is well developed, well nourished, calm, collected, and in no apparent  distress. Attention to examination is appropriate.  Respiratory: Patient is breathing without any difficulty.   NEUROLOGICAL:     Awake, alert, oriented to person, place, and time.  Speech is clear and fluent. Fund of knowledge is appropriate.   Cranial Nerves: Pupils equal round and reactive to light.  Facial tone is symmetric.    ROM of lumbar spine not tested.   He has minimal lumbar tenderness.  No abnormal lesions on exposed skin.   Strength: Side Biceps Triceps Deltoid Interossei Grip Wrist Ext. Wrist Flex.  R 5 5 5 5 5 5 5   L 5 5 5 5 5 5 5    Side Iliopsoas Quads Hamstring PF DF EHL  R 5 5 5 5 5 5   L 5 5 5 5 5 5    Reflexes are 2+ and symmetric at the biceps, triceps, brachioradialis, patella and achilles.   Hoffman's is absent.   Clonus is not present.   Bilateral upper and lower extremity sensation is intact to light touch.     He ambulates with a cane.   Medical Decision Making  Imaging: CT thoracic spine dated 05/05/22:  FINDINGS: Alignment: Prominent thoracic dextrocurvature centered at T9-10. No static listhesis.   Vertebrae: Prior cervicothoracic fusion extending from C5-T3. Hardware appears intact. Chronic compression fracture of the T1 vertebral body. Chronic appearing mild superior endplate compression deformity of T9. Chronic ununited fracture of the T3 spinous process. No acute fracture of the thoracic spine is identified. No suspicious lytic or sclerotic bone lesion.   Paraspinal and other soft tissues: Aortic and coronary artery atherosclerosis. Mild emphysema. No acute abnormality.   Disc levels: Asymmetric disc height loss and endplate spurring most pronounced at the T9-10 disc level compensatory to the curvature. Degenerative facet arthropathy is most pronounced at T10-11 and T11-T12 on the left. No findings to suggest canal stenosis of the thoracic spine by CT.   IMPRESSION: 1. No acute fracture or traumatic listhesis of the thoracic spine. 2. Chronic compression fractures of the T1 and T9 vertebral bodies. 3. Chronic ununited fracture of the T3 spinous process. 4. Prominent thoracic dextrocurvature centered at T9-10 with asymmetric disc height loss and endplate spurring most pronounced at the T9-10 disc level compensatory to the curvature. 5. Degenerative facet arthropathy is most pronounced at T10-11 and T11-T12 on the left. 6. Aortic and coronary artery atherosclerosis (ICD10-I70.0).     Electronically Signed   By: Duanne Guess D.O.   On: 05/05/2022 11:58   CT of lumbar spine dated 05/05/22:  FINDINGS: Segmentation: 5 lumbar type vertebrae.   Alignment: Lumbar levocurvature centered at L1-2. Grade 1 anterolisthesis of L4 on L5. Trace retrolisthesis at L5-S1.    Vertebrae: Mild superior endplate compression fracture of the L1 vertebral body with approximately 25% vertebral body height loss. Fracture appears to be subacute or chronic. No significant bony retropulsion. No additional fractures of the lumbar spine. No lytic or sclerotic bone lesion.   Paraspinal and other soft tissues: Aortic atherosclerosis. No acute findings.   Disc levels: Degenerative disc disease with asymmetric disc height loss is most pronounced at L2-3, L4-5, and L5-S1. Moderate-advanced multilevel facet joint arthropathy. There appears to be at least mild canal stenosis at the L4-5 level. High-grade foraminal stenosis is suspected at L2-3 on the right.   IMPRESSION: 1. Mild superior endplate compression fracture of the L1 vertebral body with approximately 25% vertebral body height loss. Fracture appears to be subacute or chronic. Correlate with point tenderness. 2. Multilevel degenerative disc disease and  facet joint arthropathy with at least mild canal stenosis at the L4-5 level. High-grade foraminal stenosis is suspected at L2-3 on the right. 3. Aortic atherosclerosis (ICD10-I70.0).     Electronically Signed   By: Duanne Guess D.O.   On: 05/05/2022 12:04  I have personally reviewed the images and agree with the above interpretation.  Assessment and Plan: [name] is a pleasant 72 y.o. male has a history of fusion C5-T3.   Had a fall on 04/30/22 with increased LBP. He has history of T1 and T9 fractures along with T3 spinus process fracture. Found to have subacute fracture of L1 on CT scan.   He has constant LBP with no leg pain. Pain is improving. No numbness, tingling, or weakness in his legs. He is wearing his TLSO.   No updated imaging done.   Treatment options discussed with patient and following plan made:   - Xrays of lumbar spine ordered and he will get these done today.  - Continue with TLSO brace when up out of bed.  - No bending, twisting, or  lifting.  - Will call him with xray results.  - Follow up with me in 4-5 weeks for recheck. Will need lumbar xrays prior to that visit.  I spent a total of 25 minutes in face-to-face and non-face-to-face activities related to this patient's care today including review of outside records, review of imaging, review of symptoms, physical exam, discussion of differential diagnosis, discussion of treatment options, and documentation.   Thank you for involving me in the care of this patient.   ADDENDUM 05/24/22:  Lumbar xrays dated 05/22/22:  FINDINGS: Depression of the central and anterior upper endplate of L1 has increased in severity since the prior CT, now with approximately 30% loss of the central and anterior vertebral body height.   No other lumbar spine fractures. No bone lesion. Skeletal structures are demineralized.   Grade 1 anterolisthesis of L4 on L5. Grade 1 retrolisthesis of L5 on S1. These are stable.   Moderate to marked loss of disc height at L2-L3. Moderate loss of disc height at L4-L5.   Prominent scoliosis, convex to the left apex at L2.   Scattered aortic atherosclerotic calcifications.   IMPRESSION: 1. Compression fracture of L1 appears increased in severity compared to the recent CT. However, this may be an artifact of positioning combined with the scoliosis. 2. No other fractures. 3. Degenerative changes as detailed without significant change compared to the prior CT.     Electronically Signed   By: Amie Portland M.D.   On: 05/24/2022 14:24  I have personally reviewed the images and agree with the above interpretation.  He may have some slight progression of L1 compression fracture. Clinically, his pain was improving and was tolerable so no change to above plan.   I tried to call him with xray results and left a message for him to call back.   Hector Leach PA-C Dept. of Neurosurgery

## 2022-05-22 ENCOUNTER — Encounter: Payer: Self-pay | Admitting: Orthopedic Surgery

## 2022-05-22 ENCOUNTER — Ambulatory Visit
Admission: RE | Admit: 2022-05-22 | Discharge: 2022-05-22 | Disposition: A | Discharge status: Home / Self Care | Payer: Medicare PPO | Source: Ambulatory Visit | Attending: Orthopedic Surgery | Admitting: Orthopedic Surgery

## 2022-05-22 ENCOUNTER — Ambulatory Visit
Admission: RE | Admit: 2022-05-22 | Discharge: 2022-05-22 | Disposition: A | Discharge status: Home / Self Care | Payer: Medicare PPO | Attending: Orthopedic Surgery | Admitting: Orthopedic Surgery

## 2022-05-22 ENCOUNTER — Ambulatory Visit (INDEPENDENT_AMBULATORY_CARE_PROVIDER_SITE_OTHER): Payer: No Typology Code available for payment source | Admitting: Orthopedic Surgery

## 2022-05-22 VITALS — BP 134/84 | Ht 68.0 in | Wt 132.0 lb

## 2022-05-22 DIAGNOSIS — S32010A Wedge compression fracture of first lumbar vertebra, initial encounter for closed fracture: Secondary | ICD-10-CM

## 2022-05-22 NOTE — Patient Instructions (Signed)
It was so nice to see you today. Thank you so much for coming in.    You have an L1 compression fracture and I think this is causing your pain.  I ordered xrays of your lower back. You can get these at [facility] (building with the white pillars) off of [address]. The address is [address] [place], [place] [address]. You do not need any appointment. I will call you with results.   You will need to get repeat xrays prior to your visit. You can go 1 hour before your appointment with me.   Wear brace when you are out of bed. No bending, twisting, or lifting.   I will see you back in 4-5 weeks. Please do not hesitate to call if you have any questions or concerns. You can also message me in [place].  Geronimo Boot PA-C [phone]

## 2022-05-25 ENCOUNTER — Telehealth: Payer: Self-pay | Admitting: Orthopedic Surgery

## 2022-05-25 NOTE — Telephone Encounter (Signed)
[phone] He is asking for his xray results.

## 2022-05-25 NOTE — Telephone Encounter (Signed)
Spoke with patient about xray results. No change to plan in note.

## 2022-07-01 NOTE — Progress Notes (Unsigned)
Referring Physician:  [facility] [address] [place],  [place] [address]  Primary Physician:  [facility]  History of Present Illness: 07/01/2022 Hector George has a history of PTSD, bipolar.   History of fusion C5-T3.   Last seen by me on 05/22/22 for subacute fracture of L1 s/p fall on 04/30/22. He has a history of T1 and T9 fractures along with T3 spinus process fracture.  He was to continue with TLSO brace. He is here for follow up and repeat xrays.   He does not have his brace on and is not wearing it. He continues with intermittent LBP that he thinks is getting worse. Pain is worse with standing/walking. He can only do this form 10-15 minutes before he needs to lay back down. No leg pain. No numbness, tingling, or weakness in his legs.    Review of Systems:  A 10 point review of systems is negative, except for the pertinent positives and negatives detailed in the HPI.  Past Medical History: Past Medical History:  Diagnosis Date   Affective bipolar disorder (HCC)    Anxiety    Claudication, class I (HCC)    Depression    Left inguinal hernia    Lumbago    Nightmares associated with chronic post-traumatic stress disorder    Osteoarthritis     Past Surgical History: Past Surgical History:  Procedure Laterality Date   BACK SURGERY     CERVICAL SPINE SURGERY     INGUINAL HERNIA REPAIR Left 02/13/2018   Procedure: HERNIA REPAIR INGUINAL ADULT;  Surgeon: Sung Amabile, DO;  Location: [facility] ORS;  Service: General;  Laterality: Left;   NOSE SURGERY      Allergies: Allergies as of 07/03/2022   (No Known Allergies)    Medications: Outpatient Encounter Medications as of 07/03/2022  Medication Sig   divalproex (DEPAKOTE ER) 500 MG 24 hr tablet Take 2 tablets (1,000 mg total) by mouth daily with supper.   FLUoxetine (PROZAC) 40 MG capsule Take 1 capsule (40 mg total) by mouth daily.   ibuprofen (ADVIL,MOTRIN) 800 MG tablet Take 1 tablet (800 mg  total) by mouth every 8 (eight) hours as needed for mild pain or moderate pain.   meclizine (ANTIVERT) 25 MG tablet Take 1 tablet (25 mg total) by mouth 3 (three) times daily as needed for dizziness or nausea.   OLANZapine (ZYPREXA) 15 MG tablet Take 1 tablet (15 mg total) by mouth at bedtime. For mood and sleep   traZODone (DESYREL) 50 MG tablet Take 1-2 tablets (50-100 mg total) by mouth at bedtime. For sleep   No facility-administered encounter medications on file as of 07/03/2022.    Social History: Social History   Tobacco Use   Smoking status: Every Day    Packs/day: .25    Types: Cigarettes   Smokeless tobacco: Never  Vaping Use   Vaping Use: Never used  Substance Use Topics   Alcohol use: No   Drug use: Yes    Types: Marijuana    Comment: daily    Family Medical History: Family History  Problem Relation Age of Onset   Heart disease Mother    Anxiety disorder Brother    Depression Brother    Anxiety disorder Sister    Depression Sister    Cancer Neg Hx     Physical Examination: There were no vitals filed for this visit.    Awake, alert, oriented to person, place, and time.  Speech is clear  and fluent. Fund of knowledge is appropriate.   Cranial Nerves: Pupils equal round and reactive to light.  Facial tone is symmetric.    ROM of lumbar spine not tested.   He has no lumbar tenderness.  No abnormal lesions on exposed skin.   Strength: Side Biceps Triceps Deltoid Interossei Grip Wrist Ext. Wrist Flex.  R 5 5 5 5 5 5 5   L 5 5 5 5 5 5 5    Side Iliopsoas Quads Hamstring PF DF EHL  R 5 5 5 5 5 5   L 5 5 5 5 5 5    Reflexes are 2+ and symmetric at the biceps, triceps, brachioradialis, patella and achilles.   Hoffman's is absent.  Clonus is not present.   Bilateral upper and lower extremity sensation is intact to light touch.     He ambulates with a cane.   Medical Decision Making  Imaging: Lumbar xrays dated 07/03/22:  Stable L1 compression fracture in  comparison to previous xrays on 05/22/22.   Above radiology report not yet available.   Assessment and Plan: Hector George is a pleasant 72 y.o. male has a history of fusion C5-T3.   Had a fall on 04/30/22 with increased LBP. He has history of T1 and T9 fractures along with T3 spinus process fracture. Found to have subacute fracture of L1 on CT scan.   He is having increased LBP that is worse with standing and walking. Has to lay down after 10-15 minutes. No leg pain. No numbness, tingling, or weakness.   Xrays from today show stable L1 compression fracture.    Treatment options discussed with patient and following plan made:   - Since has pain has gotten worse, we discussed possible referral to IR for kyphoplasty and he is interested.  - MRI of lumbar spine to further evaluate L1 compression fracture.  - He is not wearing TLSO brace. Advised to wear when he is up and walking.  - No bending, twisting, or lifting.  - Will call him with MRI results and likely refer him to IR.   I spent a total of 20 minutes in face-to-face and non-face-to-face activities related to this patient's care today including review of outside records, review of imaging, review of symptoms, physical exam, discussion of differential diagnosis, discussion of treatment options, and documentation.   Drake Leach PA-C Dept. of Neurosurgery

## 2022-07-03 ENCOUNTER — Ambulatory Visit
Admission: RE | Admit: 2022-07-03 | Discharge: 2022-07-03 | Disposition: A | Discharge status: Home / Self Care | Payer: No Typology Code available for payment source | Attending: Orthopedic Surgery | Admitting: Orthopedic Surgery

## 2022-07-03 ENCOUNTER — Ambulatory Visit (INDEPENDENT_AMBULATORY_CARE_PROVIDER_SITE_OTHER): Payer: No Typology Code available for payment source | Admitting: Orthopedic Surgery

## 2022-07-03 ENCOUNTER — Ambulatory Visit
Admission: RE | Admit: 2022-07-03 | Discharge: 2022-07-03 | Disposition: A | Discharge status: Home / Self Care | Payer: No Typology Code available for payment source | Source: Ambulatory Visit | Attending: Orthopedic Surgery | Admitting: Orthopedic Surgery

## 2022-07-03 ENCOUNTER — Encounter: Payer: Self-pay | Admitting: Orthopedic Surgery

## 2022-07-03 VITALS — BP 138/78 | HR 54 | Ht 68.0 in | Wt 129.8 lb

## 2022-07-03 DIAGNOSIS — S32010A Wedge compression fracture of first lumbar vertebra, initial encounter for closed fracture: Secondary | ICD-10-CM

## 2022-07-03 DIAGNOSIS — W19XXXD Unspecified fall, subsequent encounter: Secondary | ICD-10-CM

## 2022-07-03 DIAGNOSIS — S32010D Wedge compression fracture of first lumbar vertebra, subsequent encounter for fracture with routine healing: Secondary | ICD-10-CM

## 2022-07-03 NOTE — Patient Instructions (Signed)
It was so nice to see you today. Thank you so much for coming in.    You have an L1 compression fracture and I think this is causing your pain.  I want to get an MRI of your back to look into things further. I will call you with results of the MRI.   Wear brace when you are out of bed. No bending, twisting, or lifting.  Depending on results of the MRI, I will likely refer you to interventional radiology to consider a kyphoplasty procedure. We can discuss more when I have MRI results back.    Please do not hesitate to call if you have any questions or concerns. You can also message me in MyChart.  Drake Leach PA-C [phone]

## 2022-07-16 ENCOUNTER — Ambulatory Visit
Admission: RE | Admit: 2022-07-16 | Discharge: 2022-07-16 | Disposition: A | Discharge status: Home / Self Care | Payer: No Typology Code available for payment source | Source: Ambulatory Visit | Attending: Orthopedic Surgery | Admitting: Orthopedic Surgery

## 2022-07-16 DIAGNOSIS — S32010A Wedge compression fracture of first lumbar vertebra, initial encounter for closed fracture: Secondary | ICD-10-CM | POA: Insufficient documentation

## 2022-07-24 ENCOUNTER — Ambulatory Visit: Payer: No Typology Code available for payment source | Admitting: Orthopedic Surgery

## 2022-07-24 ENCOUNTER — Encounter: Payer: Self-pay | Admitting: Orthopedic Surgery

## 2022-07-24 DIAGNOSIS — S32010D Wedge compression fracture of first lumbar vertebra, subsequent encounter for fracture with routine healing: Secondary | ICD-10-CM

## 2022-07-24 DIAGNOSIS — W19XXXD Unspecified fall, subsequent encounter: Secondary | ICD-10-CM | POA: Diagnosis not present

## 2022-07-24 DIAGNOSIS — S32010A Wedge compression fracture of first lumbar vertebra, initial encounter for closed fracture: Secondary | ICD-10-CM

## 2022-07-24 NOTE — Progress Notes (Signed)
   Telephone Visit- Progress Note: Referring Physician:  No referring provider defined for this encounter.  Primary Physician:  [facility]  This visit was performed via telephone.  Patient location: home Provider location: office  I spent a total of 10 minutes non-face-to-face activities for this visit on the date of this encounter including review of current clinical condition and response to treatment.    Patient has given verbal consent to this telephone visits and we reviewed the limitations of a telephone visit. Patient wishes to proceed.    Chief Complaint:  review MRI results  History of Present Illness: Hector George is a 72 y.o. male has a history of PTSD, bipolar.    History of fusion C5-T3.    Last seen by me on 07/03/22 for subacute fracture of L1 s/p fall on 04/30/22. He has a history of T1 and T9 fractures along with T3 spinus process fracture.  Phone visit to discuss MRI results.   He continues with LBP that may have improved slightly, but he still cannot stand/walk for more than 10-15 minutes without significant LBP. No leg pain. No numbness, tingling, or weakness in his legs.    Exam: No exam done as this was a telephone encounter.     Imaging: MRI of lumbar spine dated 07/16/22:  FINDINGS: Segmentation:  Standard.   Alignment:  Mild scoliosis.  Degenerative anterolisthesis at L4-5.   Vertebrae: L1 compression fracture with band of mild to moderate marrow edema around the depressed superior endplate. Height loss measures 30%. No retropulsion or posterior element involvement.   Conus medullaris and cauda equina: Conus extends to the L1-2 level. Conus and cauda equina appear normal.   Paraspinal and other soft tissues: Negative for perispinal mass or inflammation.   Disc levels:   L2-L3: Disc collapse with endplate and facet spurring eccentric to the right where there is mild foraminal and subarticular recess stenosis   L3-L4: Disc  bulging and annular fissuring.  Mild facet spurring.   L4-L5: Advanced facet degeneration with spurring and anterolisthesis. Disc narrowing and bulging with endplate spurring. Asymmetric left subarticular recess stenosis with left L5 root flattening. Mild left foraminal narrowing   L5-S1:Disc space narrowing and bulging eccentric to the left. Mild left facet spurring.   IMPRESSION: 1. Nonacute but unhealed L1 compression fracture with 30% height loss. Height loss is mildly progressed from CT 05/05/2022. 2. Generalized lumbar spine degeneration with mild scoliosis and L4-5 anterolisthesis. Notable left subarticular recess stenosis at L4-5 with impingement on the left L5 nerve root.     Electronically Signed   By: Tiburcio Pea M.D.   On: 07/23/2022 05:03  I have personally reviewed the images and agree with the above interpretation.  Assessment and Plan: [name] is a pleasant 72 y.o. male has a history of fusion C5-T3.    Had a fall on 04/30/22 with increased LBP. He has history of T1 and T9 fractures along with T3 spinus process fracture. Found to have subacute fracture of L1 on CT scan.   He continues with LBP with standing/walking. Has to lay down after 10-15 minutes. No leg pain. No numbness, tingling, or weakness.   MRI shows unhealed L1 compression fracture with marrow edema.    Treatment options discussed with patient and following plan made:   - Referral to IR to consider kyphoplasty at L1.  - Continue TLSO brace for now. No bending, twisting, or lifting.   Drake Leach PA-C Neurosurgery

## 2022-07-25 ENCOUNTER — Other Ambulatory Visit: Payer: Self-pay | Admitting: Orthopedic Surgery

## 2022-07-25 DIAGNOSIS — S32010A Wedge compression fracture of first lumbar vertebra, initial encounter for closed fracture: Secondary | ICD-10-CM

## 2022-07-31 ENCOUNTER — Ambulatory Visit
Admission: RE | Admit: 2022-07-31 | Discharge: 2022-07-31 | Disposition: A | Discharge status: Home / Self Care | Payer: Non-veteran care | Source: Ambulatory Visit | Attending: Orthopedic Surgery | Admitting: Orthopedic Surgery

## 2022-07-31 ENCOUNTER — Telehealth: Payer: Self-pay | Admitting: Orthopedic Surgery

## 2022-07-31 ENCOUNTER — Other Ambulatory Visit (HOSPITAL_COMMUNITY): Payer: Self-pay | Admitting: Interventional Radiology

## 2022-07-31 DIAGNOSIS — S32010A Wedge compression fracture of first lumbar vertebra, initial encounter for closed fracture: Secondary | ICD-10-CM

## 2022-07-31 HISTORY — PX: IR RADIOLOGIST EVAL & MGMT: IMG5224

## 2022-07-31 NOTE — Telephone Encounter (Signed)
Order done. Please fax to [facility]. See instructions below.

## 2022-07-31 NOTE — Telephone Encounter (Signed)
McKenzie the nurse at [facility]. Patient was seen today at [facility] and he is a great candidate for kypho. He needs a dexa scan. Since this is [place], can you place the order to [facility]. They are the only office around here that does those scans. You can call McKenzie if you have questions.

## 2022-07-31 NOTE — Telephone Encounter (Signed)
I spoke to [place] at [facility] and she said just send her the order along with a copy of the [place]. Fax [phone].

## 2022-07-31 NOTE — Consult Note (Signed)
Chief Complaint: Patient was seen in consultation today for lumbar spine pain at the request of Luna,Stacy  Referring Physician(s): Luna,Stacy  History of Present Illness: Hector George is a 72 y.o. male Who was in his usual state of relatively good health until March 4.  He was dancing in the kitchen when he slipped and suffered a fall backwards from standing.  He developed acute onset low back pain.  Subsequent workup confirms a subacute and unhealed L1 compression fracture with approximately 30% height loss.  The fracture was first diagnosed by CT imaging on 05/05/2022.  MRI on 07/16/2022 confirms slight progression of height loss and persistent edema consistent with incomplete healing.  Mr. Aagard symptoms are quite severe.  His pain is as severe as a 10 out of 10 on a 10 point scale.  He tried wearing a TLSO brace for approximately 2 weeks but had no relief.  He takes ibuprofen throughout the day which does little to combat his pain.  He is unable to tolerate standing up or any activity for more than 15 minutes at a time.  This is significantly disabling.  He scored a 24 out of 24 on the [employer] disability questionnaire.  He denies lower extremity weakness, numbness, paresthesias or other symptoms of cord compression.  Past Medical History:  Diagnosis Date   Affective bipolar disorder (HCC)    Anxiety    Claudication, class I (HCC)    Depression    Left inguinal hernia    Lumbago    Nightmares associated with chronic post-traumatic stress disorder    Osteoarthritis     Past Surgical History:  Procedure Laterality Date   BACK SURGERY     CERVICAL SPINE SURGERY     INGUINAL HERNIA REPAIR Left 02/13/2018   Procedure: HERNIA REPAIR INGUINAL ADULT;  Surgeon: Sung Amabile, DO;  Location: [facility] ORS;  Service: General;  Laterality: Left;   IR RADIOLOGIST EVAL & MGMT  07/31/2022   NOSE SURGERY      Allergies: Patient has no known allergies.  Medications: Prior to Admission  medications   Medication Sig Start Date End Date Taking? Authorizing Provider  albuterol (ACCUNEB) 0.63 MG/3ML nebulizer solution Take 1 ampule by nebulization every 6 (six) hours as needed for wheezing.   Yes [provider]  divalproex (DEPAKOTE ER) 500 MG 24 hr tablet Take 2 tablets (1,000 mg total) by mouth daily with supper. 12/10/18  Yes Jomarie Longs, MD  FLUoxetine (PROZAC) 40 MG capsule Take 1 capsule (40 mg total) by mouth daily. 08/01/18  Yes Jomarie Longs, MD  ibuprofen (ADVIL,MOTRIN) 800 MG tablet Take 1 tablet (800 mg total) by mouth every 8 (eight) hours as needed for mild pain or moderate pain. 02/13/18  Yes Sakai, Isami, DO  OLANZapine (ZYPREXA) 15 MG tablet Take 1 tablet (15 mg total) by mouth at bedtime. For mood and sleep 12/05/18  Yes Eappen, Levin Bacon, MD  traZODone (DESYREL) 50 MG tablet Take 1-2 tablets (50-100 mg total) by mouth at bedtime. For sleep 08/01/18  Yes Jomarie Longs, MD  meclizine (ANTIVERT) 25 MG tablet Take 1 tablet (25 mg total) by mouth 3 (three) times daily as needed for dizziness or nausea. Patient not taking: Reported on 07/31/2022 01/30/19   Sharman Cheek, MD     Family History  Problem Relation Age of Onset   Heart disease Mother    Anxiety disorder Brother    Depression Brother    Anxiety disorder Sister    Depression Sister  Cancer Neg Hx     Social History   Socioeconomic History   Marital status: Married    Spouse name: ruth   Number of children: 1   Years of education: Not on file   Highest education level: Some college, no degree  Occupational History   Not on file  Tobacco Use   Smoking status: Every Day    Packs/day: .25    Types: Cigarettes   Smokeless tobacco: Never  Vaping Use   Vaping Use: Never used  Substance and Sexual Activity   Alcohol use: No   Drug use: Yes    Types: Marijuana    Comment: daily   Sexual activity: Yes  Other Topics Concern   Not on file  Social History Narrative   Not on file    Social Determinants of Health   Financial Resource Strain: High Risk (10/24/2017)   Overall Financial Resource Strain (CARDIA)    Difficulty of Paying Living Expenses: Hard  Food Insecurity: Food Insecurity Present (10/24/2017)   Hunger Vital Sign    Worried About Running Out of Food in the Last Year: Sometimes true    Ran Out of Food in the Last Year: Sometimes true  Transportation Needs: Unmet Transportation Needs (10/24/2017)   PRAPARE - Administrator, Civil Service (Medical): Yes    Lack of Transportation (Non-Medical): Yes  Physical Activity: Inactive (10/24/2017)   Exercise Vital Sign    Days of Exercise per Week: 0 days    Minutes of Exercise per Session: 0 min  Stress: Stress Concern Present (10/24/2017)   [employer] of Occupational Health - Occupational Stress Questionnaire    Feeling of Stress : Very much  Social Connections: Unknown (10/24/2017)   Social Connection and Isolation Panel [NHANES]    Frequency of Communication with Friends and Family: Not on file    Frequency of Social Gatherings with Friends and Family: Not on file    Attends Religious Services: Never    Database administrator or Organizations: No    Attends Banker Meetings: Never    Marital Status: Married    Review of Systems: A 12 point ROS discussed and pertinent positives are indicated in the HPI above.  All other systems are negative.  Review of Systems  Vital Signs: BP (!) 111/55 (BP Location: Left Arm, Patient Position: Sitting, Cuff Size: Normal)   Pulse 80   Temp 97.7 F (36.5 C) (Oral)   Resp 14   SpO2 96%   Advance Care Plan: The advanced care plan/surrogate decision maker was discussed at the time of visit and the patient did not wish to discuss or was not able to name a surrogate decision maker or provide an advance care plan.    Physical Exam Constitutional:      General: He is not in acute distress.    Appearance: Normal appearance. He is normal  weight.  HENT:     Head: Normocephalic and atraumatic.  Eyes:     General: No scleral icterus. Cardiovascular:     Rate and Rhythm: Normal rate.  Pulmonary:     Effort: Pulmonary effort is normal.  Abdominal:     General: Abdomen is flat.     Palpations: Abdomen is soft.  Musculoskeletal:       Back:     Comments: TTP at L1 And in the right paraspinal muscles.   Skin:    General: Skin is warm and dry.  Neurological:  Mental Status: He is alert and oriented to person, place, and time.  Psychiatric:        Mood and Affect: Mood normal.        Behavior: Behavior normal.       Imaging: IR Radiologist Eval & Mgmt  Result Date: 07/31/2022 EXAM: NEW PATIENT OFFICE VISIT CHIEF COMPLAINT: SEE EPIC NOTE HISTORY OF PRESENT ILLNESS: SEE EPIC NOTE REVIEW OF SYSTEMS: SEE EPIC NOTE PHYSICAL EXAMINATION: SEE EPIC NOTE ASSESSMENT AND PLAN: SEE EPIC NOTE Electronically Signed   By: Malachy Moan M.D.   On: 07/31/2022 14:16   MR LUMBAR SPINE WO CONTRAST  Result Date: 07/23/2022 CLINICAL DATA:  Lumbar compression fracture EXAM: MRI LUMBAR SPINE WITHOUT CONTRAST TECHNIQUE: Multiplanar, multisequence MR imaging of the lumbar spine was performed. No intravenous contrast was administered. COMPARISON:  Lumbar CT 05/05/2022 FINDINGS: Segmentation:  Standard. Alignment:  Mild scoliosis.  Degenerative anterolisthesis at L4-5. Vertebrae: L1 compression fracture with band of mild to moderate marrow edema around the depressed superior endplate. Height loss measures 30%. No retropulsion or posterior element involvement. Conus medullaris and cauda equina: Conus extends to the L1-2 level. Conus and cauda equina appear normal. Paraspinal and other soft tissues: Negative for perispinal mass or inflammation. Disc levels: L2-L3: Disc collapse with endplate and facet spurring eccentric to the right where there is mild foraminal and subarticular recess stenosis L3-L4: Disc bulging and annular fissuring.  Mild facet  spurring. L4-L5: Advanced facet degeneration with spurring and anterolisthesis. Disc narrowing and bulging with endplate spurring. Asymmetric left subarticular recess stenosis with left L5 root flattening. Mild left foraminal narrowing L5-S1:Disc space narrowing and bulging eccentric to the left. Mild left facet spurring. IMPRESSION: 1. Nonacute but unhealed L1 compression fracture with 30% height loss. Height loss is mildly progressed from CT 05/05/2022. 2. Generalized lumbar spine degeneration with mild scoliosis and L4-5 anterolisthesis. Notable left subarticular recess stenosis at L4-5 with impingement on the left L5 nerve root. Electronically Signed   By: Tiburcio Pea M.D.   On: 07/23/2022 05:03   DG Lumbar Spine 2-3 Views  Result Date: 07/06/2022 CLINICAL DATA:  recheck L1 fracture EXAM: LUMBAR SPINE - 2-3 VIEW COMPARISON:  05/22/2022 FINDINGS: Lumbar levoscoliosis apex L2. No evident underlying vertebral anomaly. Stable L1 compression deformity. No definite acute fracture. Narrowing of the L2-3 interspace with endplate spurring, stable. Grade 1 anterolisthesis L4-5 stable. Patchy aortic calcifications. IMPRESSION: 1. Stable L1 compression deformity. No acute findings. 2. Lumbar levoscoliosis with degenerative changes as above. Electronically Signed   By: Corlis Leak M.D.   On: 07/06/2022 19:26    Labs:  CBC: No results for input(s): "WBC", "HGB", "HCT", "PLT" in the last 8760 hours.  COAGS: No results for input(s): "INR", "APTT" in the last 8760 hours.  BMP: No results for input(s): "NA", "K", "CL", "CO2", "GLUCOSE", "BUN", "CALCIUM", "CREATININE", "GFRNONAA", "GFRAA" in the last 8760 hours.  Invalid input(s): "CMP"  LIVER FUNCTION TESTS: No results for input(s): "BILITOT", "AST", "ALT", "ALKPHOS", "PROT", "ALBUMIN" in the last 8760 hours.  TUMOR MARKERS: No results for input(s): "AFPTM", "CEA", "CA199", "CHROMGRNA" in the last 8760 hours.  Assessment and Plan:  Patient has  suffered subacute osteoporotic fracture of the L1 vertebra.   History and exam have demonstrated the following:  Acute/Subacute fracture by imaging dated 07/16/22, Pain on exam concordant with level of fracture, Failure of conservative therapy and pain refractory to narcotic pain mediation, and Significant disability on the [employer] Disability Questionnaire with 24/24 positive symptoms, reflecting significant impact/impairment of (  ADLs)   ICD-10-CM Codes that Support Medical Necessity (WelshBlog.at.aspx?[email or web address])  M80.08XA    Age-related osteoporosis with current pathological fracture, vertebra(e), initial encounter for fracture and S32.010A    Wedge compression fracture of first lumbar vertebra, initial encounter for closed fracture    Plan:  L1 vertebral body augmentation with balloon kyphoplasty  Post-procedure disposition: outpatient. Medication holds: None  The patient has suffered a fracture of the L1 vertebral body. It is recommended that patients aged 81 years or older be evaluated for possible testing or treatment of osteoporosis. A copy of this consult report is sent to the patient's referring physician.  Advanced Care Plan: The patient did not want to provide an Advanced Care Plan at the time of this visit     Total time spent on today's visit was over  40 Minutes , including both face-to-face time and non face-to-face time, personally spent on review of chart (including labs and relevant imaging), discussing further workup and treatment options, referral to specialist if needed, reviewing outside records if pertinent, answering patient questions, and coordinating care regarding L1 compression fracture as well as management strategy.   Electronically Signed:  Sterling Big 07/31/2022, 3:35 PM

## 2022-08-01 LAB — COMPLETE METABOLIC PANEL WITH GFR
AG Ratio: 1.8 (calc) (ref 1.0–2.5)
ALT: 5 U/L — ABNORMAL LOW (ref 9–46)
AST: 11 U/L (ref 10–35)
Albumin: 4 g/dL (ref 3.6–5.1)
Alkaline phosphatase (APISO): 67 U/L (ref 35–144)
BUN: 13 mg/dL (ref 7–25)
CO2: 26 mmol/L (ref 20–32)
Calcium: 8.5 mg/dL — ABNORMAL LOW (ref 8.6–10.3)
Chloride: 104 mmol/L (ref 98–110)
Creat: 0.83 mg/dL (ref 0.70–1.28)
Globulin: 2.2 g/dL (calc) (ref 1.9–3.7)
Glucose, Bld: 71 mg/dL (ref 65–99)
Potassium: 4.1 mmol/L (ref 3.5–5.3)
Sodium: 141 mmol/L (ref 135–146)
Total Bilirubin: 0.5 mg/dL (ref 0.2–1.2)
Total Protein: 6.2 g/dL (ref 6.1–8.1)
eGFR: 92 mL/min/{1.73_m2} (ref >=60)

## 2022-08-01 LAB — CBC
HCT: 37.3 % — ABNORMAL LOW (ref 38.5–50.0)
Hemoglobin: 12.4 g/dL — ABNORMAL LOW (ref 13.2–17.1)
MCH: 30.3 pg (ref 27.0–33.0)
MCHC: 33.2 g/dL (ref 32.0–36.0)
MCV: 91.2 fL (ref 80.0–100.0)
MPV: 11.4 fL (ref 7.5–12.5)
Platelets: 186 10*3/uL (ref 140–400)
RBC: 4.09 10*6/uL — ABNORMAL LOW (ref 4.20–5.80)
RDW: 13.5 % (ref 11.0–15.0)
WBC: 6.3 10*3/uL (ref 3.8–10.8)

## 2022-08-01 LAB — PROTIME-INR
INR: 1
Prothrombin Time: 10.2 s (ref 9.0–11.5)

## 2022-08-01 LAB — SPECIMEN COMPROMISED

## 2022-08-01 NOTE — Telephone Encounter (Signed)
Order faxed to [facility] and Ronne Binning is aware.

## 2022-08-02 ENCOUNTER — Ambulatory Visit
Admission: RE | Admit: 2022-08-02 | Discharge: 2022-08-02 | Disposition: A | Discharge status: Home / Self Care | Payer: Non-veteran care | Source: Ambulatory Visit | Attending: Orthopedic Surgery | Admitting: Orthopedic Surgery

## 2022-08-02 DIAGNOSIS — S32010A Wedge compression fracture of first lumbar vertebra, initial encounter for closed fracture: Secondary | ICD-10-CM

## 2022-08-02 HISTORY — PX: IR KYPHO LUMBAR INC FX REDUCE BONE BX UNI/BIL CANNULATION INC/IMAGING: IMG5519

## 2022-08-02 MED ORDER — MIDAZOLAM HCL 2 MG/2ML IJ SOLN: 1.0000 mg | INTRAMUSCULAR | Status: DC | PRN

## 2022-08-02 MED ORDER — KETOROLAC TROMETHAMINE 30 MG/ML IJ SOLN: 30.0000 mg | Freq: Once | INTRAMUSCULAR | Status: DC

## 2022-08-02 MED ORDER — CEFAZOLIN SODIUM-DEXTROSE 2-4 GM/100ML-% IV SOLN
2.0000 g | INTRAVENOUS | Status: AC
  Administered 2022-08-02: 2 g via INTRAVENOUS

## 2022-08-02 MED ORDER — SODIUM CHLORIDE 0.9 % IV SOLN: INTRAVENOUS | Status: DC

## 2022-08-02 MED ORDER — MIDAZOLAM HCL 5 MG/5ML IJ SOLN
INTRAMUSCULAR | Status: AC | PRN
  Administered 2022-08-02: .5 mg via INTRAVENOUS

## 2022-08-02 MED ORDER — MIDAZOLAM HCL 2 MG/2ML IJ SOLN
INTRAMUSCULAR | Status: AC | PRN
  Administered 2022-08-02 (×4): .5 mg via INTRAVENOUS

## 2022-08-02 MED ORDER — FENTANYL CITRATE (PF) 100 MCG/2ML IJ SOLN
INTRAMUSCULAR | Status: AC | PRN
  Administered 2022-08-02 (×5): 25 ug via INTRAVENOUS

## 2022-08-02 MED ORDER — FENTANYL CITRATE PF 50 MCG/ML IJ SOSY: 25.0000 ug | PREFILLED_SYRINGE | INTRAMUSCULAR | Status: DC | PRN

## 2022-08-02 NOTE — Discharge Instructions (Signed)
Kyphoplasty Post Procedure Discharge Instructions  May resume a regular diet and any medications that you routinely take (including pain medications). However, if you are taking Aspirin or an anticoagulant/blood thinner you will be told when you can resume taking these by the healthcare provider. No driving day of procedure. The day of your procedure take it easy. You may use an ice pack as needed to injection sites on back.  Ice to back 30 minutes on and 30 minutes off, as needed. May remove bandaids tomorrow after taking a shower. Replace daily with a clean bandaid until healed.  Do not lift anything heavier than a milk jug for 1-2 weeks or determined by your physician.  Follow up with your physician in 2 weeks.    Please contact our office at [phone] for the following symptoms or if you have any questions:  Fever greater than 100 degrees Increased swelling, pain, or redness at injection site. Increased back and/or leg pain New numbness or change in symptoms from before the procedure.    Thank you for visiting [facility]. 

## 2022-08-02 NOTE — Progress Notes (Signed)
Pt is back at the nurses station. Pt is alert, calm and pleasant and has no s/s of distress. Wife is at the bedside. Will continue to monitor and tx pt according to MD orders and will go over discharge instructions with pt and family.

## 2022-08-09 ENCOUNTER — Other Ambulatory Visit: Payer: Self-pay | Admitting: Interventional Radiology

## 2022-08-09 ENCOUNTER — Telehealth: Payer: Self-pay

## 2022-08-09 DIAGNOSIS — S32000A Wedge compression fracture of unspecified lumbar vertebra, initial encounter for closed fracture: Secondary | ICD-10-CM

## 2022-08-09 NOTE — Telephone Encounter (Signed)
Phone call to pt to follow up from his kyphoplasty on 02/01/23. Pt reports his pain is completely gone post procedure but is still having "a little soreness". Pt reports he is able to move around a little better. Pt denies any signs of infection, redness at the site, draining or fever. Pt has no complaints at this time and will be scheduled for a telephone follow up with Dr. Archer Asa next week. Pt advised to call back if anything were to change or any concerns arise and we will arrange an in person appointment. Pt verbalized understanding.

## 2022-08-16 ENCOUNTER — Ambulatory Visit
Admission: RE | Admit: 2022-08-16 | Discharge: 2022-08-16 | Disposition: A | Discharge status: Home / Self Care | Payer: Non-veteran care | Source: Ambulatory Visit | Attending: Interventional Radiology | Admitting: Interventional Radiology

## 2022-08-16 DIAGNOSIS — S32000A Wedge compression fracture of unspecified lumbar vertebra, initial encounter for closed fracture: Secondary | ICD-10-CM

## 2022-08-16 HISTORY — PX: IR RADIOLOGIST EVAL & MGMT: IMG5224

## 2022-08-16 NOTE — Progress Notes (Signed)
Chief Complaint: Patient was consulted remotely today (TeleHealth) for low back pain at the request of Adair Lemar K.    Referring Physician(s): Benjerman Molinelli K  History of Present Illness: Alman Null is a 72 y.o. male with a history of L1 compression fracture.  He underwent cement augmentation with balloon kyphoplasty on 08/02/2022.  We spoke over the phone today for his 2-week follow-up evaluation.  He reports that his back pain is better.  He estimates it to be approximately 40% improved.  However, he continues to have low back pain when he stands for long periods of time.  We discussed the possibility of epidural steroid injections.  He notes that he has had injections before and they were not helpful.  I recommended that he speak to his referring physician.  They may want to send him back for additional injections.  Additionally, we performed injections and we will be happy to help him if they prefer.  Otherwise, he is well-healed and has no other complaints at this time.  Past Medical History:  Diagnosis Date   Affective bipolar disorder (HCC)    Anxiety    Claudication, class I (HCC)    Depression    Left inguinal hernia    Lumbago    Nightmares associated with chronic post-traumatic stress disorder    Osteoarthritis     Past Surgical History:  Procedure Laterality Date   BACK SURGERY     CERVICAL SPINE SURGERY     INGUINAL HERNIA REPAIR Left 02/13/2018   Procedure: HERNIA REPAIR INGUINAL ADULT;  Surgeon: Sung Amabile, DO;  Location: [facility] ORS;  Service: General;  Laterality: Left;   IR KYPHO LUMBAR INC FX REDUCE BONE BX UNI/BIL CANNULATION INC/IMAGING  08/02/2022   IR RADIOLOGIST EVAL & MGMT  07/31/2022   IR RADIOLOGIST EVAL & MGMT  08/16/2022   NOSE SURGERY      Allergies: Patient has no known allergies.  Medications: Prior to Admission medications   Medication Sig Start Date End Date Taking? Authorizing Provider  albuterol (ACCUNEB) 0.63 MG/3ML  nebulizer solution Take 1 ampule by nebulization every 6 (six) hours as needed for wheezing.    [provider]  divalproex (DEPAKOTE ER) 500 MG 24 hr tablet Take 2 tablets (1,000 mg total) by mouth daily with supper. 12/10/18   Jomarie Longs, MD  FLUoxetine (PROZAC) 40 MG capsule Take 1 capsule (40 mg total) by mouth daily. 08/01/18   Jomarie Longs, MD  ibuprofen (ADVIL,MOTRIN) 800 MG tablet Take 1 tablet (800 mg total) by mouth every 8 (eight) hours as needed for mild pain or moderate pain. 02/13/18   Sung Amabile, DO  meclizine (ANTIVERT) 25 MG tablet Take 1 tablet (25 mg total) by mouth 3 (three) times daily as needed for dizziness or nausea. Patient not taking: Reported on 07/31/2022 01/30/19   Sharman Cheek, MD  OLANZapine (ZYPREXA) 15 MG tablet Take 1 tablet (15 mg total) by mouth at bedtime. For mood and sleep 12/05/18   Jomarie Longs, MD  traZODone (DESYREL) 50 MG tablet Take 1-2 tablets (50-100 mg total) by mouth at bedtime. For sleep 08/01/18   Jomarie Longs, MD     Family History  Problem Relation Age of Onset   Heart disease Mother    Anxiety disorder Brother    Depression Brother    Anxiety disorder Sister    Depression Sister    Cancer Neg Hx     Social History   Socioeconomic History   Marital status: Married  Spouse name: ruth   Number of children: 1   Years of education: Not on file   Highest education level: Some college, no degree  Occupational History   Not on file  Tobacco Use   Smoking status: Every Day    Packs/day: .25    Types: Cigarettes   Smokeless tobacco: Never  Vaping Use   Vaping Use: Never used  Substance and Sexual Activity   Alcohol use: No   Drug use: Yes    Types: Marijuana    Comment: daily   Sexual activity: Yes  Other Topics Concern   Not on file  Social History Narrative   Not on file   Social Determinants of Health   Financial Resource Strain: High Risk (10/24/2017)   Overall Financial Resource Strain (CARDIA)     Difficulty of Paying Living Expenses: Hard  Food Insecurity: Food Insecurity Present (10/24/2017)   Hunger Vital Sign    Worried About Running Out of Food in the Last Year: Sometimes true    Ran Out of Food in the Last Year: Sometimes true  Transportation Needs: Unmet Transportation Needs (10/24/2017)   PRAPARE - Administrator, Civil Service (Medical): Yes    Lack of Transportation (Non-Medical): Yes  Physical Activity: Inactive (10/24/2017)   Exercise Vital Sign    Days of Exercise per Week: 0 days    Minutes of Exercise per Session: 0 min  Stress: Stress Concern Present (10/24/2017)   [employer] of Occupational Health - Occupational Stress Questionnaire    Feeling of Stress : Very much  Social Connections: Unknown (10/24/2017)   Social Connection and Isolation Panel [NHANES]    Frequency of Communication with Friends and Family: Not on file    Frequency of Social Gatherings with Friends and Family: Not on file    Attends Religious Services: Never    Database administrator or Organizations: No    Attends Banker Meetings: Never    Marital Status: Married    Review of Systems  Review of Systems: A 12 point ROS discussed and pertinent positives are indicated in the HPI above.  All other systems are negative.  Advance Care Plan: The advanced care plan/surrogate decision maker was discussed at the time of visit and the patient did not wish to discuss or was not able to name a surrogate decision maker or provide an advance care plan.    Physical Exam No direct physical exam was performed (except for noted visual exam findings with Video Visits).    Vital Signs: There were no vitals taken for this visit.  Imaging: IR Radiologist Eval & Mgmt  Result Date: 08/16/2022 EXAM: ESTABLISHED PATIENT OFFICE VISIT CHIEF COMPLAINT: SEE EPIC NOTE HISTORY OF PRESENT ILLNESS: SEE EPIC NOTE REVIEW OF SYSTEMS: SEE EPIC NOTE PHYSICAL EXAMINATION: SEE EPIC NOTE  ASSESSMENT AND PLAN: SEE EPIC NOTE Electronically Signed   By: Malachy Moan M.D.   On: 08/16/2022 16:19   IR KYPHO LUMBAR INC FX REDUCE BONE BX UNI/BIL CANNULATION INC/IMAGING  Result Date: 08/02/2022 CLINICAL DATA:  72 year old gentleman with highly symptomatic osteoporotic insufficiency fracture of the L1 vertebral body. He presents for cement augmentation with balloon kyphoplasty. EXAM: FLUOROSCOPIC GUIDED KYPHOPLASTY OF THE L1 VERTEBRAL BODY COMPARISON:  MRI lumbar spine 07/16/2022 MEDICATIONS: As antibiotic prophylaxis, 2 g Ancef was ordered pre-procedure and administered intravenously within 1 hour of incision. 30 mg Toradol was administered intravenously by radiology nursing following the procedure. ANESTHESIA/SEDATION: Moderate (conscious) sedation was employed during this  procedure. A total of Versed 2.5 mg and Fentanyl 125 mcg was administered intravenously. Moderate Sedation Time: 30 minutes. The patient's level of consciousness and vital signs were monitored continuously by radiology nursing throughout the procedure under my direct supervision. FLUOROSCOPY TIME:  Radiation exposure index: 46.3 mGy reference air kerma COMPLICATIONS: None immediate. PROCEDURE: The procedure, risks (including but not limited to bleeding, infection, organ damage), benefits, and alternatives were explained to the patient. Questions regarding the procedure were encouraged and answered. The patient understands and consents to the procedure. The patient has suffered a fracture of the L1 vertebral body. It is recommended that patients aged 54 years or older be evaluated for possible testing or treatment of osteoporosis. The patient was placed prone on the fluoroscopic table. The skin overlying the lumbar region was then prepped and draped in the usual sterile fashion. Maximal barrier sterile technique was utilized including caps, mask, sterile gowns, sterile gloves, sterile drape, hand hygiene and skin antiseptic.  Intravenous Fentanyl and Versed were administered as conscious sedation during continuous cardiorespiratory monitoring by the radiology RN. The left pedicle at L1 was then infiltrated with 1% lidocaine followed by the advancement of a Kyphon trocar needle through the left pedicle into the posterior one-third of the vertebral body. Subsequently, the osteo drill was advanced to the anterior third of the vertebral body. The osteo drill was retracted. Through the working cannula, a Kyphon inflatable bone tamp 15 x 2.5 was advanced and positioned with the distal marker approximately 5 mm from the anterior aspect of the cortex. Appropriate positioning was confirmed on the AP projection. At this time, the balloon was expanded using contrast via a Kyphon inflation syringe device via micro tubing. In similar fashion, the right L1 pedicle was infiltrated with 1% lidocaine followed by the advancement of a second Kyphon trocar needle through the right pedicle into the posterior third of the vertebral body. Subsequently, the osteo drill was coaxially advanced to the anterior right third. The osteo drill was exchanged for a Kyphon inflatable bone tamp 15 x 2.5, advanced to the 5 mm of the anterior aspect of the cortex. The balloon was then expanded using contrast as above. Inflations were continued until there was near apposition with the superior end plate. At this time, methylmethacrylate mixture was reconstituted in the Kyphon bone mixing device system. This was then loaded into the delivery mechanism, attached to Kyphon bone fillers. The balloons were deflated and removed followed by the instillation of methylmethacrylate mixture with excellent filling in the AP and lateral projections. No extravasation was noted in the disk spaces or posteriorly into the spinal canal. No epidural venous contamination was seen. The working cannulae and the bone filler were then retrieved and removed. Hemostasis was achieved with manual  compression. The patient tolerated the procedure well without immediate postprocedural complication. IMPRESSION: 1. Technically successful L1 vertebral body augmentation using balloon kyphoplasty. 2. Per CMS PQRS reporting requirements (PQRS Measure 24): Given the patient's age of greater than 50 and the fracture site (hip, distal radius, or spine), the patient should be tested for osteoporosis using DXA, and the appropriate treatment considered based on the DXA results. Electronically Signed   By: Malachy Moan M.D.   On: 08/02/2022 14:23   IR Radiologist Eval & Mgmt  Result Date: 07/31/2022 EXAM: NEW PATIENT OFFICE VISIT CHIEF COMPLAINT: SEE EPIC NOTE HISTORY OF PRESENT ILLNESS: SEE EPIC NOTE REVIEW OF SYSTEMS: SEE EPIC NOTE PHYSICAL EXAMINATION: SEE EPIC NOTE ASSESSMENT AND PLAN: SEE EPIC NOTE Electronically Signed  By: Malachy Moan M.D.   On: 07/31/2022 14:16    Labs:  CBC: Recent Labs    07/31/22 1430  WBC 6.3  HGB 12.4*  HCT 37.3*  PLT 186    COAGS: Recent Labs    07/31/22 1430  INR 1.0    BMP: Recent Labs    07/31/22 1430  NA 141  K 4.1  CL 104  CO2 26  GLUCOSE 71  BUN 13  CALCIUM 8.5*  CREATININE 0.83    LIVER FUNCTION TESTS: Recent Labs    07/31/22 1430  BILITOT 0.5  AST 11  ALT 5*  PROT 6.2    TUMOR MARKERS: No results for input(s): "AFPTM", "CEA", "CA199", "CHROMGRNA" in the last 8760 hours.  Assessment and Plan:  72 year old gentleman 2 weeks status post L1 cement augmentation with balloon kyphoplasty.  His lower back pain is approximately 40% improved.  Residual pain may be due to underlying degenerative disc disease and spinal stenosis.  If desired, we could help with epidural steroid injections, particularly at the L4-L5 level.     Electronically Signed: Sterling Big 08/16/2022, 4:39 PM   I spent a total of 15 Minutes in remote  clinical consultation, greater than 50% of which was counseling/coordinating care for low back  pain.    Visit type: Audio only (telephone). Audio (no video) only due to patient preference. Alternative for in-person consultation at [facility], [address] [place], [place], [place]. This visit type was conducted due to national recommendations for restrictions regarding the COVID-19 Pandemic (e.g. social distancing).  This format is felt to be most appropriate for this patient at this time.  All issues noted in this document were discussed and addressed.

## 2023-07-25 DIAGNOSIS — D329 Benign neoplasm of meninges, unspecified: Secondary | ICD-10-CM | POA: Diagnosis not present

## 2023-07-25 DIAGNOSIS — D320 Benign neoplasm of cerebral meninges: Secondary | ICD-10-CM | POA: Diagnosis not present

## 2023-07-30 DIAGNOSIS — D496 Neoplasm of unspecified behavior of brain: Secondary | ICD-10-CM | POA: Diagnosis not present

## 2023-12-09 ENCOUNTER — Inpatient Hospital Stay
Admission: RE | Admit: 2023-12-09 | Discharge: 2023-12-09 | Disposition: A | Discharge status: Home / Self Care | Payer: Self-pay | Source: Ambulatory Visit | Attending: Neurosurgery | Admitting: Neurosurgery

## 2023-12-09 ENCOUNTER — Other Ambulatory Visit: Payer: Self-pay

## 2023-12-09 DIAGNOSIS — Z049 Encounter for examination and observation for unspecified reason: Secondary | ICD-10-CM

## 2023-12-10 ENCOUNTER — Ambulatory Visit: Admitting: Neurosurgery

## 2023-12-10 ENCOUNTER — Encounter: Payer: Self-pay | Admitting: Neurosurgery

## 2023-12-10 VITALS — BP 137/80 | HR 60 | Temp 97.7°F | Ht 66.0 in | Wt 130.2 lb

## 2023-12-10 DIAGNOSIS — D329 Benign neoplasm of meninges, unspecified: Secondary | ICD-10-CM | POA: Diagnosis not present

## 2023-12-10 NOTE — Progress Notes (Signed)
 Assessment : 73 year old veteran with a past medical history significant for tobacco abuse, back surgery and nasal surgery who is otherwise a poor historian but does not remember why he had an MRI of his brain.  He says that at 1 point there was scanning his lower back and found this in his head.  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  He comes with an MRI done in May 2025 which demonstrates this olfactory groove meningioma.  Apparently he has had 2 MRIs done at the [facility], this 1 at [facility] but the second MRI from [facility] is not here.  According to the [facility] referral port, there is growth of this mass.  Patient is wholly asymptomatic.  He was accompanied by his wife.  Plan : I reviewed the MRI with them and I showed him the mass.  According to them it is growing into the nasal cavity which I cannot confirm on this MRI from [facility] from May.  We talked about the options but given the fact that I do not have all the studies, I explicitly told them to get a CD with all the MR's from the [facility].  He says that he is going for lumbar shots in a couple weeks but is yet to make that appointment.  Once he has made that appointment then he will stop by the MRI department and get it burned onto a CD.  We will make a tentative appointment for 6 weeks from now.  If by then he has not had his back shots and has not had the CD burned, he will call us  and postpone the appointment.  I also told him that more likely than not if it is growing, then surgery would not be my recommended treatment but I would recommend pursuing radiosurgery.  I will discuss this in more detail with them when I see them back.   Social History   Socioeconomic History   Marital status: Married    Spouse name: ruth   Number of children: 1   Years of education: Not on file   Highest education level: Some college, no degree  Occupational History   Not on file  Tobacco Use   Smoking status:  Every Day    Current packs/day: 0.25    Types: Cigarettes   Smokeless tobacco: Never  Vaping Use   Vaping status: Never Used  Substance and Sexual Activity   Alcohol use: No   Drug use: Yes    Types: Marijuana    Comment: daily   Sexual activity: Yes  Other Topics Concern   Not on file  Social History Narrative   Not on file   Social Drivers of Health   Financial Resource Strain: High Risk (10/24/2017)   Overall Financial Resource Strain (CARDIA)    Difficulty of Paying Living Expenses: Hard  Food Insecurity: Food Insecurity Present (10/24/2017)   Hunger Vital Sign    Worried About Running Out of Food in the Last Year: Sometimes true    Ran Out of Food in the Last Year: Sometimes true  Transportation Needs: Unmet Transportation Needs (10/24/2017)   PRAPARE - Administrator, Civil Service (Medical): Yes    Lack of Transportation (Non-Medical): Yes  Physical Activity: Inactive (10/24/2017)   Exercise Vital Sign    Days of Exercise per Week: 0 days    Minutes of Exercise per Session: 0 min  Stress: Stress Concern  Present (10/24/2017)   [employer] of Occupational Health - Occupational Stress Questionnaire    Feeling of Stress : Very much  Social Connections: Unknown (10/24/2017)   Social Connection and Isolation Panel    Frequency of Communication with Friends and Family: Not on file    Frequency of Social Gatherings with Friends and Family: Not on file    Attends Religious Services: Never    Active Member of Clubs or Organizations: No    Attends Banker Meetings: Never    Marital Status: Married  Catering manager Violence: Not At Risk (10/24/2017)   Humiliation, Afraid, Rape, and Kick questionnaire    Fear of Current or Ex-Partner: No    Emotionally Abused: No    Physically Abused: No    Sexually Abused: No    Family History  Problem Relation Age of Onset   Heart disease Mother    Anxiety disorder Brother    Depression Brother    Anxiety  disorder Sister    Depression Sister    Cancer Neg Hx     Not on File  Past Medical History:  Diagnosis Date   Affective bipolar disorder (HCC)    Anxiety    Claudication, class I    Depression    Left inguinal hernia    Lumbago    Nightmares associated with chronic post-traumatic stress disorder    Osteoarthritis     Past Surgical History:  Procedure Laterality Date   BACK SURGERY     CERVICAL SPINE SURGERY     INGUINAL HERNIA REPAIR Left 02/13/2018   Procedure: HERNIA REPAIR INGUINAL ADULT;  Surgeon: Tye Millet, DO;  Location: [facility] ORS;  Service: General;  Laterality: Left;   IR KYPHO LUMBAR INC FX REDUCE BONE BX UNI/BIL CANNULATION INC/IMAGING  08/02/2022   IR RADIOLOGIST EVAL & MGMT  07/31/2022   IR RADIOLOGIST EVAL & MGMT  08/16/2022   NOSE SURGERY       Physical Exam HENT:     Head: Normocephalic.     Nose: Nose normal.  Eyes:     Pupils: Pupils are equal, round, and reactive to light.  Cardiovascular:     Rate and Rhythm: Normal rate.  Pulmonary:     Effort: Pulmonary effort is normal.  Abdominal:     General: Abdomen is flat.  Musculoskeletal:     Cervical back: Normal range of motion.  Neurological:     General: No focal deficit present.     Mental Status: He is alert.     Cranial Nerves: Cranial nerves 2-12 are intact.     Sensory: Sensation is intact.     Motor: Motor function is intact.     Coordination: Coordination is intact.     Gait: Gait is intact.        Results for orders placed or performed during the hospital encounter of 05/02/20  CT Head Wo Contrast   Narrative   CLINICAL DATA:  Head trauma, minor (Age >= 65y)  Head and neck pain after multiple falls.  EXAM: CT HEAD WITHOUT CONTRAST  TECHNIQUE: Contiguous axial images were obtained from the base of the skull through the vertex without intravenous contrast.  COMPARISON:  Head CT 01/30/2019  FINDINGS: Brain: Generalized atrophy is similar to prior. There  is ventriculomegaly, similar to prior exam. No hemorrhage or evidence of acute ischemia. No subdural or extra-axial collection. No midline shift or mass effect.  Vascular: No hyperdense vessel or unexpected calcification.  Skull: No fracture or focal  lesion.  Sinuses/Orbits: Occasional opacification of ethmoid air cells, improved in appearance from prior. Mastoid air cells are clear. Orbits are unremarkable.  Other: No confluent scalp contusion.  IMPRESSION: 1. No acute intracranial abnormality. No skull fracture. 2. Generalized atrophy. Ventriculomegaly is likely due to central atrophy, and stable from prior exams. Recommend clinical correlation for symptoms of normal pressure hydrocephalus.   Electronically Signed   By: Andrea Gasman M.D.   On: 05/02/2020 16:48

## 2024-01-21 ENCOUNTER — Ambulatory Visit: Admitting: Neurosurgery

## 2024-02-11 ENCOUNTER — Ambulatory Visit: Admitting: Neurosurgery

## 2024-03-06 ENCOUNTER — Encounter: Payer: Self-pay | Admitting: Neurosurgery

## 2024-03-06 ENCOUNTER — Ambulatory Visit (INDEPENDENT_AMBULATORY_CARE_PROVIDER_SITE_OTHER): Admitting: Neurosurgery

## 2024-03-06 VITALS — BP 115/71 | HR 65 | Temp 97.6°F | Ht 66.0 in | Wt 124.2 lb

## 2024-03-06 DIAGNOSIS — D320 Benign neoplasm of cerebral meninges: Secondary | ICD-10-CM | POA: Diagnosis not present

## 2024-03-06 DIAGNOSIS — D329 Benign neoplasm of meninges, unspecified: Secondary | ICD-10-CM

## 2024-03-06 NOTE — Progress Notes (Signed)
 74 year old gentleman with back and neck problems who is a very poor historian who came to us  after he was diagnosed at the [facility] with a anterior skull base meningioma.  Reportedly, this had grown.  I only had 1 set of images for my review and I told them to request the older images and bring them along so we can confirm that it has grown.  He returns today and says that the [facility] would not release the MRI without a form signed by us .  He is doing well and does not have any symptoms from this.  On the T2 sequences, this olfactory groove meningioma does not have any surrounding edema.  I told him that if there is minimal growth in this, then I recommend that we wait until next year and get a repeat MRI of the brain with and without contrast.  If there is change, then we can pursue radiosurgery.  I told him that the notion that this is going into the sinuses only somewhat true because it is minimally protruding into the ethmoid if at all.  I will see him back in 1 year with a repeat MRI of the brain with and without contrast.  If anything changes prior to that, he will call me.  We gave him a form to give to the [facility] to get the old MRIs.

## 2025-03-09 ENCOUNTER — Ambulatory Visit: Admitting: Neurosurgery
